# Patient Record
Sex: Female | Born: 1980 | Race: Black or African American | Hispanic: No | Marital: Single | Smoking: Current some day smoker
Health system: Southern US, Community
[De-identification: ages and names within clinical notes are randomized; demographics above are authoritative.]

## PROBLEM LIST (undated history)

## (undated) DIAGNOSIS — I480 Paroxysmal atrial fibrillation: Secondary | ICD-10-CM

## (undated) HISTORY — DX: Morbid (severe) obesity due to excess calories: E66.01

## (undated) HISTORY — DX: Paroxysmal atrial fibrillation: I48.0

---

## 2010-05-10 ENCOUNTER — Emergency Department (HOSPITAL_BASED_OUTPATIENT_CLINIC_OR_DEPARTMENT_OTHER): Admission: EM | Admit: 2010-05-10 | Discharge: 2010-05-11 | Payer: Self-pay | Admitting: Emergency Medicine

## 2010-09-28 LAB — PREGNANCY, URINE: Preg Test, Ur: NEGATIVE

## 2010-09-28 LAB — RAPID STREP SCREEN (MED CTR MEBANE ONLY): Streptococcus, Group A Screen (Direct): NEGATIVE

## 2012-11-01 ENCOUNTER — Encounter (HOSPITAL_BASED_OUTPATIENT_CLINIC_OR_DEPARTMENT_OTHER): Payer: Self-pay | Admitting: Emergency Medicine

## 2012-11-01 ENCOUNTER — Emergency Department (HOSPITAL_BASED_OUTPATIENT_CLINIC_OR_DEPARTMENT_OTHER)
Admission: EM | Admit: 2012-11-01 | Discharge: 2012-11-01 | Disposition: A | Discharge status: Home / Self Care | Payer: Medicaid Other | Attending: Emergency Medicine | Admitting: Emergency Medicine

## 2012-11-01 DIAGNOSIS — F17200 Nicotine dependence, unspecified, uncomplicated: Secondary | ICD-10-CM | POA: Insufficient documentation

## 2012-11-01 DIAGNOSIS — M5412 Radiculopathy, cervical region: Secondary | ICD-10-CM | POA: Insufficient documentation

## 2012-11-01 MED ORDER — HYDROCODONE-ACETAMINOPHEN 5-325 MG PO TABS: 2.0000 | ORAL_TABLET | ORAL | Status: DC | PRN

## 2012-11-01 MED ORDER — PREDNISONE 20 MG PO TABS: ORAL_TABLET | ORAL | Status: DC

## 2012-11-01 NOTE — ED Notes (Signed)
PA-C at bedside 

## 2012-11-01 NOTE — ED Provider Notes (Signed)
History     CSN: [id number]  Arrival date & time 11/01/12  1027   First MD Initiated Contact with Patient 11/01/12 [phone]      Chief Complaint  Patient presents with  . Arm Pain    right    (Consider location/radiation/quality/duration/timing/severity/associated sxs/prior treatment) HPI This 32 year old female has a one-week history gradual onset constant waxing and waning right-sided neck pain radiating down the right arm to the hand without specific neurologic distribution, she is no weakness no trauma no swelling no redness to her arm and she has had intermittent slight numbness and tingling at times they can last for several hours at a time is not present today. She is no trauma. She is no midline neck pain. She is no chest pain palpitations cough shortness breath abdominal pain back pain or other concerns. There is no treatment prior to arrival. Her pain is mild this morning was moderately severe at nighttime for several days. Her pain is worse with certain position changes of her head and right arm. She has not had problems like this in the past. Her pain is worse with position changes of the right arm when working. History reviewed. No pertinent past medical history.  History reviewed. No pertinent past surgical history.  No family history on file.  History  Substance Use Topics  . Smoking status: Current Some Day Smoker  . Smokeless tobacco: Not on file  . Alcohol Use: Yes     Comment: socially    OB History   Grav Para Term Preterm Abortions TAB SAB Ect Mult Living                  Review of Systems 10 Systems reviewed and are negative for acute change except as noted in the HPI. Allergies  Review of patient's allergies indicates no known allergies.  Home Medications   Current Outpatient Rx  Name  Route  Sig  Dispense  Refill  . HYDROcodone-acetaminophen (NORCO) 5-325 MG per tablet   Oral   Take 2 tablets by mouth every 4 (four) hours as needed for pain.   20  tablet   0   . predniSONE (DELTASONE) 20 MG tablet      3 tabs po day one, then 2 tabs daily x 4 days   11 tablet   0     BP 151/90  Pulse 85  Temp(Src) 97.5 F (36.4 C) (Oral)  Resp 16  Ht 5\' 1"  (1.549 m)  Wt 260 lb (117.935 kg)  BMI 49.15 kg/m2  SpO2 100%  LMP 10/25/2012  Physical Exam  Nursing note and vitals reviewed. Constitutional:  Awake, alert, nontoxic appearance.  HENT:  Head: Atraumatic.  Eyes: Right eye exhibits no discharge. Left eye exhibits no discharge.  Neck: Neck supple.  No cervical midline tenderness, the patient has reproducible right paracervical soft tissue tenderness right trapezius muscle region tenderness  Cardiovascular: Normal rate and regular rhythm.   No murmur heard. Pulmonary/Chest: Effort normal and breath sounds normal. No respiratory distress. She has no wheezes. She has no rales. She exhibits no tenderness.  Abdominal: Soft. There is no tenderness. There is no rebound.  Musculoskeletal: She exhibits no tenderness.  Baseline ROM, no obvious new focal weakness. Right arm and shoulder are nontender. Left arm and legs are nontender. Right arm has normal light touch image durations of the axillary median radial and ulnar nerve function as well as out of 5 motor strength testing in dosing nerve distributions with normal light  touch and capillary refill less than 2 seconds symmetric bilateral upper extremity reflexes both arms are nontender without swelling or color change noted.  Neurological:  Mental status and motor strength appears baseline for patient and situation.  Skin: No rash noted.  Psychiatric: She has a normal mood and affect.    ED Course  Procedures (including critical care time)  Labs Reviewed - No data to display No results found.   1. Cervical radiculopathy       MDM  Patient informed of clinical course, understand medical decision-making process, and agree with plan. I doubt any other EMC precluding discharge at  this time including, but not necessarily limited to the following:CVA.        Hurman Horn, MD 11/01/12 [phone]

## 2012-11-01 NOTE — ED Notes (Signed)
Pt reports intermittant Rt arm pain that radiates from Rt scapula down Rt arm to fingertips x 1 week. Denies injury, chest pain, or other s/sx.

## 2015-08-08 ENCOUNTER — Encounter (HOSPITAL_BASED_OUTPATIENT_CLINIC_OR_DEPARTMENT_OTHER): Payer: Self-pay | Admitting: *Deleted

## 2015-08-08 ENCOUNTER — Emergency Department (HOSPITAL_BASED_OUTPATIENT_CLINIC_OR_DEPARTMENT_OTHER)
Admission: EM | Admit: 2015-08-08 | Discharge: 2015-08-08 | Disposition: A | Discharge status: Home / Self Care | Payer: Medicaid Other | Attending: Emergency Medicine | Admitting: Emergency Medicine

## 2015-08-08 ENCOUNTER — Emergency Department (HOSPITAL_BASED_OUTPATIENT_CLINIC_OR_DEPARTMENT_OTHER): Payer: Medicaid Other

## 2015-08-08 DIAGNOSIS — F17200 Nicotine dependence, unspecified, uncomplicated: Secondary | ICD-10-CM | POA: Insufficient documentation

## 2015-08-08 DIAGNOSIS — I4891 Unspecified atrial fibrillation: Secondary | ICD-10-CM | POA: Insufficient documentation

## 2015-08-08 DIAGNOSIS — Z3202 Encounter for pregnancy test, result negative: Secondary | ICD-10-CM | POA: Diagnosis not present

## 2015-08-08 DIAGNOSIS — R110 Nausea: Secondary | ICD-10-CM | POA: Insufficient documentation

## 2015-08-08 DIAGNOSIS — R55 Syncope and collapse: Secondary | ICD-10-CM | POA: Insufficient documentation

## 2015-08-08 DIAGNOSIS — R079 Chest pain, unspecified: Secondary | ICD-10-CM | POA: Diagnosis present

## 2015-08-08 LAB — CBC WITH DIFFERENTIAL/PLATELET
BASOS ABS: 0 10*3/uL (ref 0.0–0.1)
BASOS PCT: 0 %
EOS ABS: 0 10*3/uL (ref 0.0–0.7)
EOS PCT: 1 %
HCT: 39.5 % (ref 36.0–46.0)
Hemoglobin: 12.8 g/dL (ref 12.0–15.0)
LYMPHS ABS: 2.6 10*3/uL (ref 0.7–4.0)
Lymphocytes Relative: 40 %
MCH: 22.5 pg — ABNORMAL LOW (ref 26.0–34.0)
MCHC: 32.4 g/dL (ref 30.0–36.0)
MCV: 69.4 fL — AB (ref 78.0–100.0)
Monocytes Absolute: 0.6 10*3/uL (ref 0.1–1.0)
Monocytes Relative: 9 %
NEUTROS PCT: 50 %
Neutro Abs: 3.2 10*3/uL (ref 1.7–7.7)
PLATELETS: 238 10*3/uL (ref 150–400)
RBC: 5.69 MIL/uL — AB (ref 3.87–5.11)
RDW: 15.1 % (ref 11.5–15.5)
WBC: 6.4 10*3/uL (ref 4.0–10.5)

## 2015-08-08 LAB — COMPREHENSIVE METABOLIC PANEL
ALK PHOS: 54 U/L (ref 38–126)
ALT: 42 U/L (ref 14–54)
ANION GAP: 9 (ref 5–15)
AST: 56 U/L — ABNORMAL HIGH (ref 15–41)
Albumin: 4 g/dL (ref 3.5–5.0)
BUN: 10 mg/dL (ref 6–20)
CALCIUM: 9.1 mg/dL (ref 8.9–10.3)
CHLORIDE: 104 mmol/L (ref 101–111)
CO2: 25 mmol/L (ref 22–32)
CREATININE: 0.76 mg/dL (ref 0.44–1.00)
Glucose, Bld: 143 mg/dL — ABNORMAL HIGH (ref 65–99)
Potassium: 4 mmol/L (ref 3.5–5.1)
SODIUM: 138 mmol/L (ref 135–145)
Total Bilirubin: 0.4 mg/dL (ref 0.3–1.2)
Total Protein: 7.7 g/dL (ref 6.5–8.1)

## 2015-08-08 LAB — MAGNESIUM: Magnesium: 2 mg/dL (ref 1.7–2.4)

## 2015-08-08 LAB — TSH: TSH: 2.256 u[IU]/mL (ref 0.350–4.500)

## 2015-08-08 LAB — TROPONIN I

## 2015-08-08 LAB — T4, FREE: FREE T4: 1.02 ng/dL (ref 0.61–1.12)

## 2015-08-08 LAB — PREGNANCY, URINE: PREG TEST UR: NEGATIVE

## 2015-08-08 LAB — D-DIMER, QUANTITATIVE: D-Dimer, Quant: 0.35 ug/mL-FEU (ref 0.00–0.50)

## 2015-08-08 MED ORDER — PROPOFOL 10 MG/ML IV BOLUS
INTRAVENOUS | Status: AC
  Filled 2015-08-08: qty 40

## 2015-08-08 MED ORDER — ADENOSINE 6 MG/2ML IV SOLN
INTRAVENOUS | Status: AC
  Filled 2015-08-08: qty 6

## 2015-08-08 MED ORDER — DILTIAZEM HCL 25 MG/5ML IV SOLN
15.0000 mg | Freq: Once | INTRAVENOUS | Status: AC
  Administered 2015-08-08: 15 mg via INTRAVENOUS
  Filled 2015-08-08 (×2): qty 5

## 2015-08-08 MED ORDER — ETOMIDATE 2 MG/ML IV SOLN
10.0000 mg | Freq: Once | INTRAVENOUS | Status: AC
  Administered 2015-08-08: 10 mg via INTRAVENOUS

## 2015-08-08 MED ORDER — SODIUM CHLORIDE 0.9 % IV BOLUS (SEPSIS)
1000.0000 mL | Freq: Once | INTRAVENOUS | Status: AC
  Administered 2015-08-08: 1000 mL via INTRAVENOUS

## 2015-08-08 MED ORDER — ETOMIDATE 2 MG/ML IV SOLN
INTRAVENOUS | Status: AC
  Filled 2015-08-08: qty 10

## 2015-08-08 NOTE — ED Notes (Signed)
EDP to discuss options for treatment

## 2015-08-08 NOTE — ED Notes (Signed)
Pt responds to light verbal stimuli, follows commands w/o hesitiation

## 2015-08-08 NOTE — ED Notes (Signed)
PCXR done

## 2015-08-08 NOTE — ED Provider Notes (Signed)
CSN: [id number]     Arrival date without improvement.  & time 08/08/15  8295 History   First MD Initiated Contact with Patient 08/08/15 [phone]     Chief Complaint  Patient presents with  . Chest Pain  .   (Consider location/radiation/quality/duration/timing/severity/associated sxs/prior Treatment) Patient is a 35 y.o. female presenting with chest pain.  Chest Pain Pain location:  Substernal area Pain quality: pressure   Pain radiates to:  L arm Pain radiates to the back: no   Pain severity:  Severe Onset quality:  Sudden Timing:  Constant Progression:  Unchanged Chronicity:  New Relieved by:  Nothing Worsened by:  Nothing tried Ineffective treatments:  None tried Associated symptoms: fatigue, nausea, near-syncope and shortness of breath   Associated symptoms: no abdominal pain, no back pain, no cough, no diaphoresis, no fever, no headache and not vomiting   Risk factors: no diabetes mellitus, no high cholesterol, no hypertension, no prior DVT/PE and no surgery     History reviewed. No pertinent past medical history. History reviewed. No pertinent past surgical history. No family history on file. Social History  Substance Use Topics  . Smoking status: Current Some Day Smoker  . Smokeless tobacco: None  . Alcohol Use: Yes     Comment: socially   OB History    No data available     Review of Systems  Constitutional: Positive for fatigue. Negative for fever and diaphoresis.  HENT: Negative for sore throat.   Eyes: Negative for visual disturbance.  Respiratory: Positive for shortness of breath. Negative for cough.   Cardiovascular: Positive for chest pain and near-syncope.  Gastrointestinal: Positive for nausea. Negative for vomiting, abdominal pain, diarrhea and constipation.  Genitourinary: Negative for dysuria and difficulty urinating. Frequency: chronic.  Musculoskeletal: Negative for back pain and neck pain.  Skin: Negative for rash.  Neurological: Negative for  syncope and headaches.      Allergies  Review of patient's allergies indicates no known allergies.  Home Medications   Prior to Admission medications   Medication Sig Start Date End Date Taking? Authorizing Provider  HYDROcodone-acetaminophen (NORCO) 5-325 MG per tablet Take 2 tablets by mouth every 4 (four) hours as needed for pain. 11/01/12   Wayland Salinas, MD  predniSONE (DELTASONE) 20 MG tablet 3 tabs po day one, then 2 tabs daily x 4 days 11/01/12   Wayland Salinas, MD   BP 142/98 mmHg  Pulse 82  Temp(Src) 97.9 F (36.6 C) (Oral)  Resp 20  SpO2 98%  LMP  (LMP Unknown) Physical Exam  Constitutional: She is oriented to person, place, and time. She appears well-developed and well-nourished. No distress.  HENT:  Head: Normocephalic and atraumatic.  Eyes: Conjunctivae and EOM are normal.  Neck: Normal range of motion.  Cardiovascular: Normal heart sounds and intact distal pulses.  An irregularly irregular rhythm present. Tachycardia present.  Exam reveals no gallop and no friction rub.   No murmur heard. Pulmonary/Chest: Effort normal and breath sounds normal. No respiratory distress. She has no wheezes. She has no rales.  Abdominal: Soft. She exhibits no distension. There is no tenderness. There is no guarding.  Musculoskeletal: She exhibits no edema or tenderness.  Neurological: She is alert and oriented to person, place, and time. She has normal strength. GCS eye subscore is 4. GCS verbal subscore is 5. GCS motor subscore is 6.  Skin: Skin is warm and dry. No rash noted. She is not diaphoretic. No erythema.  Nursing note and vitals reviewed.   ED  Course  .Sedation Date/Time: 08/08/2015 9:49 AM Performed by: Alvira Monday Authorized by: Alvira Monday  Consent:    Consent obtained:  Verbal and written   Consent given by:  Patient Indications:    Sedation purpose:  Cardioversion   Procedure necessitating sedation performed by:  Physician performing sedation   Intended  level of sedation:  Moderate (conscious sedation) Pre-sedation assessment:    ASA classification: class 1 - normal, healthy patient     Neck mobility: normal     Mouth opening:  3 or more finger widths   Mallampati score:  III - soft palate, base of uvula visible   Pre-sedation assessments completed and reviewed: airway patency, cardiovascular function, hydration status, mental status, nausea/vomiting, pain level and respiratory function   Immediate pre-procedure details:    Reassessment: Patient reassessed immediately prior to procedure     Reviewed: vital signs, relevant labs/tests and NPO status     Verified: bag valve mask available, emergency equipment available, intubation equipment available, IV patency confirmed, oxygen available and suction available   Procedure details (see MAR for exact dosages):    Sedation start time:  08/08/2015 9:19 AM   Preoxygenation:  Nasal cannula   Sedation:  Etomidate   Intra-procedure monitoring:  Blood pressure monitoring, continuous capnometry, frequent LOC assessments, frequent vital sign checks, continuous pulse oximetry and cardiac monitor   Intra-procedure events: none     Intra-procedure management:  Airway repositioning   Sedation end time:  08/08/2015 9:25 AM Post-procedure details:    Post-sedation assessment completed:  08/08/2015 9:53 AM   Attendance: Constant attendance by certified staff until patient recovered     Recovery: Patient returned to pre-procedure baseline     Post-sedation assessments completed and reviewed: airway patency, cardiovascular function, hydration status, mental status, nausea/vomiting, pain level and respiratory function     Patient is stable for discharge or admission: Yes     Patient tolerance:  Tolerated well, no immediate complications .Cardioversion Date/Time: 08/08/2015 9:53 AM Performed by: Alvira Monday Authorized by: Alvira Monday Consent: Verbal consent obtained. Written consent obtained. Risks and  benefits: risks, benefits and alternatives were discussed Consent given by: patient Required items: required blood products, implants, devices, and special equipment available Patient identity confirmed: verbally with patient Time out: Immediately prior to procedure a "time out" was called to verify the correct patient, procedure, equipment, support staff and site/side marked as required. Patient sedated: yes Sedation type: moderate (conscious) sedation Sedatives: etomidate Sedation start date/time: 08/08/2015 9:19 AM Sedation end date/time: 08/08/2015 9:25 AM Cardioversion basis: emergent Pre-procedure rhythm: atrial fibrillation Patient position: patient was placed in a supine position Chest area: chest area exposed Electrodes: pads Number of attempts: 1 Attempt 1 mode: synchronous Attempt 1 shock (in Joules): 100 Attempt 1 outcome: conversion to normal sinus rhythm Post-procedure rhythm: normal sinus rhythm Patient tolerance: Patient tolerated the procedure well with no immediate complications   (including critical care time) Labs Review Labs Reviewed  CBC WITH DIFFERENTIAL/PLATELET - Abnormal; Notable for the following:    RBC 5.69 (*)    MCV 69.4 (*)    MCH 22.5 (*)    All other components within normal limits  COMPREHENSIVE METABOLIC PANEL - Abnormal; Notable for the following:    Glucose, Bld 143 (*)    AST 56 (*)    All other components within normal limits  TROPONIN I  TSH  PREGNANCY, URINE  T4, FREE  MAGNESIUM  D-DIMER, QUANTITATIVE (NOT AT [facility])    Imaging Review Dg Chest Portable  1 View  08/08/2015  CLINICAL DATA:  Shortness of breath, tachycardia and chest pain. EXAM: PORTABLE CHEST 1 VIEW COMPARISON:  05/11/2010 FINDINGS: The heart size and mediastinal contours are within normal limits. There is no evidence of pulmonary edema, consolidation, pneumothorax, nodule or pleural fluid. The visualized skeletal structures are unremarkable. IMPRESSION: No active  disease. Electronically Signed   By: Irish Lack M.D.   On: 08/08/2015 09:06   I have personally reviewed and evaluated these images and lab results as part of my medical decision-making.   EKG Interpretation   Date/Time:  Sunday August 08 2015 09:21:55 EST Ventricular Rate:  111 PR Interval:  120 QRS Duration: 79 QT Interval:  326 QTC Calculation: 443 R Axis:   51 Text Interpretation:  Sinus tachycardia Borderline repolarization  abnormality, nonspecific TW changes Since last ECG, atrial fibrillation  has resolved Confirmed by [facility] MD, Nezar Buckles ([id number]) on 08/08/2015 9:34:36  AM      CRITICAL CARE: Atrial fibrillation with RVR Performed by: Rhae Lerner   Total critical care time: 30 minutes  Critical care time was exclusive of separately billable procedures and treating other patients.  Critical care was necessary to treat or prevent imminent or life-threatening deterioration.  Critical care was time spent personally by me on the following activities: development of treatment plan with patient and/or surrogate as well as nursing, discussions with consultants, evaluation of patient's response to treatment, examination of patient, obtaining history from patient or surrogate, ordering and performing treatments and interventions, ordering and review of laboratory studies, ordering and review of radiographic studies, pulse oximetry and re-evaluation of patient's condition.  MDM   Final diagnoses:  Atrial fibrillation with RVR (HCC)   34 year old female with no significant medical history presents with concern of chest pressure and shortness of breath.  Patient tachycardic in triage and vagal manuevers attempted with no change. On arrival to pt's room in the emergency department patient is noted to be in atrial fibrillation with rapid ventricular rate, which is likely etiology of her chest pressure. 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   Labs show electrolytes WNL.  Troponin WNL.  DDimer negative and pt low risk Wells and doubt PE.  Reports drinking etoh last night, and not having symptoms until waking this AM at 8AM  Patient given 15 mg of diltiazem without change in heart rate.  Discussed risks and benefits with patient of cardioversion versus initiating diltiazem gtt, and given chest pressure with no other medical problems, and atrial fibrillation less than 24hr, agreed on cardioversion.  Patient cardioverted with 100J with return to sinus rhythm. Patient chest pain-free after return to normal sinus rhythm.  After cardioversion, discussed follow up with Dr. Elberta Fortis of Cardiology and agree that in setting of likely holiday heart, patient will likely not need rate controlling medications, however will follow up closely in rhythm clinic. GIven IVF with improvement in HR to 90s. BP majority 130s/80s. Patient feels back to baseline. Patient discharged in stable condition with understanding of reasons to return.     Alvira Monday, MD 08/08/15 2053

## 2015-08-08 NOTE — ED Notes (Signed)
EDP immediately to room

## 2015-08-08 NOTE — ED Notes (Signed)
Pt up to bathroom, voices no complaints, gait very steady, required min assistance

## 2015-08-08 NOTE — ED Notes (Signed)
Pt alert and oriented, able to tx to bedside commode with min assistance.

## 2015-08-08 NOTE — ED Notes (Signed)
Pt states has chest pain , onset this am, pt was awoken with chest pain, ant chest pain with radiation to left arm

## 2015-08-08 NOTE — Discharge Instructions (Signed)
Atrial Fibrillation °Atrial fibrillation is a type of irregular or rapid heartbeat (arrhythmia). In atrial fibrillation, the heart quivers continuously in a chaotic pattern. This occurs when parts of the heart receive disorganized signals that make the heart unable to pump blood normally. This can increase the risk for stroke, heart failure, and other heart-related conditions. There are different types of atrial fibrillation, including: °· Paroxysmal atrial fibrillation. This type starts suddenly, and it usually stops on its own shortly after it starts. °· Persistent atrial fibrillation. This type often lasts longer than a week. It may stop on its own or with treatment. °· Long-lasting persistent atrial fibrillation. This type lasts longer than 12 months. °· Permanent atrial fibrillation. This type does not go away. °Talk with your health care provider to learn about the type of atrial fibrillation that you have. °CAUSES °This condition is caused by some heart-related conditions or procedures, including: °· A heart attack. °· Coronary artery disease. °· Heart failure. °· Heart valve conditions. °· High blood pressure. °· Inflammation of the sac that surrounds the heart (pericarditis). °· Heart surgery. °· Certain heart rhythm disorders, such as Wolf-Parkinson-White syndrome. °Other causes include: °· Pneumonia. °· Obstructive sleep apnea. °· Blockage of an artery in the lungs (pulmonary embolism, or PE). °· Lung cancer. °· Chronic lung disease. °· Thyroid problems, especially if the thyroid is overactive (hyperthyroidism). °· Caffeine. °· Excessive alcohol use or illegal drug use. °· Use of some medicines, including certain decongestants and diet pills. °Sometimes, the cause cannot be found. °RISK FACTORS °This condition is more likely to develop in: °· People who are older in age. °· People who smoke. °· People who have diabetes mellitus. °· People who are overweight (obese). °· Athletes who exercise  vigorously. °SYMPTOMS °Symptoms of this condition include: °· A feeling that your heart is beating rapidly or irregularly. °· A feeling of discomfort or pain in your chest. °· Shortness of breath. °· Sudden light-headedness or weakness. °· Getting tired easily during exercise. °In some cases, there are no symptoms. °DIAGNOSIS °Your health care provider may be able to detect atrial fibrillation when taking your pulse. If detected, this condition may be diagnosed with: °· An electrocardiogram (ECG). °· A Holter monitor test that records your heartbeat patterns over a 24-hour period. °· Transthoracic echocardiogram (TTE) to evaluate how blood flows through your heart. °· Transesophageal echocardiogram (TEE) to view more detailed images of your heart. °· A stress test. °· Imaging tests, such as a CT scan or chest X-ray. °· Blood tests. °TREATMENT °The main goals of treatment are to prevent blood clots from forming and to keep your heart beating at a normal rate and rhythm. The type of treatment that you receive depends on many factors, such as your underlying medical conditions and how you feel when you are experiencing atrial fibrillation. °This condition may be treated with: °· Medicine to slow down the heart rate, bring the heart's rhythm back to normal, or prevent clots from forming. °· Electrical cardioversion. This is a procedure that resets your heart's rhythm by delivering a controlled, low-energy shock to the heart through your skin. °· Different types of ablation, such as catheter ablation, catheter ablation with pacemaker, or surgical ablation. These procedures destroy the heart tissues that send abnormal signals. When the pacemaker is used, it is placed under your skin to help your heart beat in a regular rhythm. °HOME CARE INSTRUCTIONS °· Take over-the counter and prescription medicines only as told by your health care provider. °·   If your health care provider prescribed a blood-thinning medicine  (anticoagulant), take it exactly as told. Taking too much blood-thinning medicine can cause bleeding. If you do not take enough blood-thinning medicine, you will not have the protection that you need against stroke and other problems.  Do not use tobacco products, including cigarettes, chewing tobacco, and e-cigarettes. If you need help quitting, ask your health care provider.  If you have obstructive sleep apnea, manage your condition as told by your health care provider.  Do not drink alcohol.  Do not drink beverages that contain caffeine, such as coffee, soda, and tea.  Maintain a healthy weight. Do not use diet pills unless your health care provider approves. Diet pills may make heart problems worse.  Follow diet instructions as told by your health care provider.  Exercise regularly as told by your health care provider.  Keep all follow-up visits as told by your health care provider. This is important. PREVENTION  Avoid drinking beverages that contain caffeine or alcohol.  Avoid certain medicines, especially medicines that are used for breathing problems.  Avoid certain herbs and herbal medicines, such as those that contain ephedra or ginseng.  Do not use illegal drugs, such as cocaine and amphetamines.  Do not smoke.  Manage your high blood pressure. SEEK MEDICAL CARE IF:  You notice a change in the rate, rhythm, or strength of your heartbeat.  You are taking an anticoagulant and you notice increased bruising.  You tire more easily when you exercise or exert yourself. SEEK IMMEDIATE MEDICAL CARE IF:  You have chest pain, abdominal pain, sweating, or weakness.  You feel nauseous.  You notice blood in your vomit, bowel movement, or urine.  You have shortness of breath.  You suddenly have swollen feet and ankles.  You feel dizzy.  You have sudden weakness or numbness of the face, arm, or leg, especially on one side of the body.  You have trouble speaking,  trouble understanding, or both (aphasia).  Your face or your eyelid droops on one side. These symptoms may represent a serious problem that is an emergency. Do not wait to see if the symptoms will go away. Get medical help right away. Call your local emergency services (911 in the [place] Do not drive yourself to the hospital.   This information is not intended to replace advice given to you by your health care provider. Make sure you discuss any questions you have with your health care provider.   Document Released: 07/03/2005 Document Revised: 03/24/2015 Document Reviewed: 10/28/2014 Elsevier Interactive Patient Education 2016 [employer].  Emergency Department Resource Guide 1) Find a Doctor and Pay Out of Pocket Although you won't have to find out who is covered by your insurance plan, it is a good idea to ask around and get recommendations. You will then need to call the office and see if the doctor you have chosen will accept you as a new patient and what types of options they offer for patients who are self-pay. Some doctors offer discounts or will set up payment plans for their patients who do not have insurance, but you will need to ask so you aren't surprised when you get to your appointment.  2) Contact Your Local Health Department Not all health departments have doctors that can see patients for sick visits, but many do, so it is worth a call to see if yours does. If you don't know where your local health department is, you can check in your phone  book. The CDC also has a tool to help you locate your state's health department, and many state websites also have listings of all of their local health departments.  3) Find a Walk-in Clinic If your illness is not likely to be very severe or complicated, you may want to try a walk in clinic. These are popping up all over the country in pharmacies, drugstores, and shopping centers. They're usually staffed by nurse practitioners or physician  assistants that have been trained to treat common illnesses and complaints. They're usually fairly quick and inexpensive. However, if you have serious medical issues or chronic medical problems, these are probably not your best option.  No Primary Care Doctor: - Call Health Connect at  [phone] - they can help you locate a primary care doctor that  accepts your insurance, provides certain services, etc. - Physician Referral Service- [phone]  Chronic Pain Problems: Organization         Address  Phone   Notes  [facility]  [phone] Patients need to be referred by their primary care doctor.   Medication Assistance: Organization         Address  Phone   Notes  [facility] Medication [facility] [address] [place], [place] [address] [phone] --Must be a resident of [facility] [place] -- Must have NO insurance coverage whatsoever (no Medicaid/ Medicare, etc.) -- The pt. MUST have a primary care doctor that directs their care regularly and follows them in the community   MedAssist  [phone]   [employer]  [phone]    Agencies that provide inexpensive medical care: Organization         Address  Phone   Notes  [facility]  [phone]   [facility]    [phone]   [facility] [address] [place], [place] [address] [phone]   [facility] [address], [place] [phone]   Planned Parenthood    [phone]   [facility]    [phone]   Community Health and [facility]  [address], [place] Phone:  [phone], Fax:  [phone] Hours of Operation:  9 am - 6 pm, M-F.  Also accepts Medicaid/Medicare and self-pay.  [facility]  [address], [place] Phone: [phone], Fax: [phone]) [phone]. Hours of Operation:  8:30 am - 5:30 pm, M-F.  Also accepts  Medicaid and self-pay.  [facility] [place] [address], [place] Phone: [phone]) [phone]   [facility] [address], [place], [place] [phone], Ext. [phone] Mondays & Thursdays: 7-9 AM.  First 15 patients are seen on a first come, first serve basis.    Medicaid-accepting [facility] Providers:  Organization         Address  Phone   Notes  [facility] [address], [place] [phone] Also accepts self-pay patients.  [facility] [address], [place]  [phone]   [facility] [address], [place] [phone]   [facility] [address] [place] [address], [place] [phone]   Renaye RakersVeita Bland [address], [place]   [phone] Only accepts [place] Access [place] patients after they have their name applied to their card.  Self-Pay (no insurance) in [facility]:  Organization         Address  Phone   Notes  Sickle Cell Patients, [facility] [place] [phone]   [facility] Urgent Care Protection [phone]   [facility] [place]  [place], [place], [place] [phone]   [facility]/Dr. Osei-Bonsu  [address] [place] or [address], [place] [phone] Phone number for both [place] and [place] locations is the same.  Urgent Medical and [facility] [address], [place] [phone]   [facility] [address] [place] or [address] [phone]   [facility] [address] [place] [phone], phone; [phone], fax Sees patients 1st and 3rd Saturday of every month.  Must not qualify for public or private insurance (i.e. Medicaid, Medicare, [place] Health Choice, Veterans' Benefits)  Household  income should be no more than 200% of the poverty level The clinic cannot treat you if you are pregnant or think you are pregnant  Sexually transmitted diseases are not treated at the clinic.    Dental Care: Organization         Address  Phone  Notes  [facility] Department of [facility] [place] [phone] Accepts children up to age 36 who are enrolled in [place] or [place]; pregnant women with a Medicaid card; and children who have applied for Medicaid or [place] Health Choice, but were declined, whose parents can pay a reduced fee at time of service.  [facility] Department of [facility]  [address], [place] [phone] Accepts children up to age 23 who are enrolled in [place] or [place]; pregnant women with a Medicaid card; and children who have applied for Medicaid or [place] Health Choice, but were declined, whose parents can pay a reduced fee at time of service.  [place] Adult [facility] Access PROGRAM  [facility] [phone] Patients are seen by appointment only. Walk-ins are not accepted. [place] will see patients 72 years of age and older. Monday - Tuesday (8am-5pm) Most Wednesdays (8:30-5pm) $30 per visit, cash only  [facility] Adult Dental Access PROGRAM  [address], [facility] [phone] Patients are seen by appointment only. Walk-ins are not accepted. [place] will see patients 85 years of age and older. One Wednesday Evening (Monthly: Volunteer Based).  $30 per visit, cash only  [place]  [phone] for adults; Children under age 67, call Graduate Pediatric Dentistry at [phone]. Children aged 51-14, please call [phone] to request a pediatric application.  Dental services are provided in all areas of dental care including fillings, crowns and bridges, complete and partial dentures, implants, gum  treatment, root canals, and extractions. Preventive care is also provided. Treatment is provided to both adults and children. Patients are selected via a lottery and there is often a waiting list.   [facility] [address], [place]  [phone] [email or web address]   [facility] [address] [place], [place] [phone], Ext. [phone] Second and Fourth Thursday of each month, opens at 6:30 AM; Clinic ends at 9 AM.  Patients are seen on a first-come first-served basis, and a limited number  are seen during each clinic.   [facility]  [address] Hillard Danker [place], [place] [phone]   Eligibility Requirements You must have lived in [place], [place], or [place] counties for at least the last three months.   You cannot be eligible for state or federal sponsored [employer], including [employer], [place], or [employer].   You generally cannot be eligible for healthcare insurance through your employer.    How to apply: Eligibility screenings are held every Tuesday and Wednesday afternoon from 1:00 pm until 4:00 pm. You do not need an appointment for the interview!  [facility] [address] [place], [place], [place]   [place]  [place] Department  [place]  [phone]    Behavioral Health Resources in the Community: Intensive Outpatient Programs Organization         Address  Phone  Notes  [facility] [place]. [address] [place], [place] [phone]   [facility] Outpatient [address] [place], [place]   ADS: Alcohol & Drug Svcs [address], [place], [place]   [place] [address],  [facility], [place] or [phone]   Substance Abuse Resources Organization         Address  Phone  Notes  Alcohol and  Drug Services  [phone]   [place]  [phone]   The [place]   Chinita Pester  [phone]   Residential & Outpatient Substance Abuse Program  [phone]   Psychological Services Organization         Address  Phone  Notes  [facility] [place]  [place]  [phone]   [place] [address] [place] or [phone]    Mobile Crisis Teams Organization         Address  Phone  Notes  [facility], Mobile Crisis Care Unit  [phone]   [facility]  [address] [place], [place]   [place] Levels [address] [place] [phone]    Self-Help/Support Groups Organization         Address  Phone             Notes  [place]. of [place] - variety of support groups  [place] Call for more information  Narcotics Anonymous (NA), [facility]aring [facility] [address], [employer] [place]  2 meetings at this location   Special educational needs teacher         Address  Phone  Notes  ASAP Residential Treatment [place],    [place]  [phone]   [facility]  [address], [place] [id number], [place], [place]   [place], [address] [phone] Admissions: 8am-3pm M-F  Incentives Substance [place] [address],    [place], [place] [phone]   [facility] [address] Jadene Pierini [place], [place]   [facility] [address]  [place], [place]   Insight Programs - Intensive Outpatient [address], [place], [place]   [facility] ([place].,  [place], [place] or [phone]   [facility] Services (RTS) [address] [place], [place] Accepts Medicaid  Fellowship  [place] [address]  [place] [phone] Substance Abuse/Addiction Treatment   [facility] Organization         Address  Phone  Notes  [facility]  [phone]   Angie Fava, PhD [address] Ervin Knack [place], [place]   [phone] or [phone]   [facility] Behavioral   [address] [place], [place] [phone]   [facility] [address], [place], [place] [phone] Insurance/Medicaid/sponsorship through [facility] and Families [address]                                    [place], [place] [phone] Therapy/tele-psych/case  [facility] [address], [place] [phone]    Dr. Lolly Mustache  [phone]   [facility] [place]  [employer] [facility] In[facility] Care Facility Dept. 1) [address] [place] 2) [address], [place] 3)  [address], [place] [phone]  [phone]   [facility] Child Abuse Hotline [phone] or [phone] (After Hours)

## 2015-08-08 NOTE — ED Notes (Signed)
Pt resting quietly, cont on cardiac monitor with cont POX and int NBP q78min

## 2015-08-08 NOTE — Sedation Documentation (Signed)
Patient denies pain and is resting comfortably.  

## 2015-08-08 NOTE — ED Notes (Signed)
Family at bedside. 

## 2015-08-08 NOTE — Sedation Documentation (Signed)
Cardioverted at 100 J 

## 2015-08-10 ENCOUNTER — Ambulatory Visit (HOSPITAL_COMMUNITY)
Admission: RE | Admit: 2015-08-10 | Discharge: 2015-08-10 | Disposition: A | Discharge status: Home / Self Care | Payer: Medicaid Other | Source: Ambulatory Visit | Attending: Nurse Practitioner | Admitting: Nurse Practitioner

## 2015-08-10 VITALS — BP 122/74 | HR 87 | Ht 62.0 in | Wt 275.8 lb

## 2015-08-10 DIAGNOSIS — Z6841 Body Mass Index (BMI) 40.0 and over, adult: Secondary | ICD-10-CM | POA: Insufficient documentation

## 2015-08-10 DIAGNOSIS — I480 Paroxysmal atrial fibrillation: Secondary | ICD-10-CM | POA: Insufficient documentation

## 2015-08-10 DIAGNOSIS — R0683 Snoring: Secondary | ICD-10-CM | POA: Insufficient documentation

## 2015-08-10 DIAGNOSIS — F17200 Nicotine dependence, unspecified, uncomplicated: Secondary | ICD-10-CM | POA: Diagnosis not present

## 2015-08-11 NOTE — Progress Notes (Signed)
Patient ID: Victoria Melendez, female   DOB: 08-19-1980, 35 y.o.   MRN: [id number]     Primary Care Physician: No primary care provider on file. Referring Physician: ER f/u   Victoria Melendez is a 35 y.o. female with a h/o morbid obesity that had a ER visit 1/22 for afib with rvr.with concerns of chest pressure and shortness of breath. Patient tachycardic in triage and vagal manuevers attempted with no change. On arrival to pt's room in the emergency department patient is noted to be in atrial fibrillation with rapid ventricular rate, which is likely etiology of her chest pressure. 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 Labs show electrolytes WNL. Troponin WNL. DDimer negative and pt low risk Wells and doubt PE. Reports drinking etoh last night, and not having symptoms until waking this AM at 8AM  Patient given 15 mg of diltiazem without change in heart rate. Discussed risks and benefits with patient of cardioversion versus initiating diltiazem gtt, and given chest pressure with no other medical problems, and atrial fibrillation less than 24hr, agreed on cardioversion. Patient cardioverted with 100J with return to sinus rhythm. Patient chest pain-free after return to normal sinus rhythm. After cardioversion, discussed follow up with Dr. Elberta Fortis of Cardiology and agree that in setting of likely holiday heart, patient will likely not need rate controlling medications, however will follow up closely in rhythm clinic. GIven IVF with improvement in HR to 90s. BP majority 130s/80s. Patient feels back to baseline. Patient discharged in stable condition with understanding of reasons to return.   In the afib clinic today for f/u. She reports no further afib. Discussed triggers for afib. Discussed no more than 2 alcoholic drinks a week,she denies smoking, minimal caffeine. Discussed inactivity and obesity as being highly correlated with afib. She does snore and has periods  of apnea. No illicit drug use.  Today, she denies symptoms of palpitations, chest pain, shortness of breath, orthopnea, PND, lower extremity edema, dizziness, presyncope, syncope, or neurologic sequela. The patient is tolerating medications without difficulties and is otherwise without complaint today.   No past medical history on file. No past surgical history on file.  No current outpatient prescriptions on file.   No current facility-administered medications for this encounter.    No Known Allergies  Social History   Social History  . Marital Status: Single    Spouse Name: N/A  . Number of Children: N/A  . Years of Education: N/A   Occupational History  . Not on file.   Social History Main Topics  . Smoking status: Current Some Day Smoker  . Smokeless tobacco: Not on file  . Alcohol Use: Yes     Comment: socially  . Drug Use: No  . Sexual Activity: Yes    Birth Control/ Protection: None   Other Topics Concern  . Not on file   Social History Narrative    No family history on file.  ROS- All systems are reviewed and negative except as per the HPI above  Physical Exam: Filed Vitals:   08/10/15 1533  BP: 122/74  Pulse: 87  Height:  (1.575 m)  Weight: 275 lb 12.8 oz (125.102 kg)    GEN- The patient is well appearing, alert and oriented x 3 today.   Head- normocephalic, atraumatic Eyes-  Sclera clear, conjunctiva pink Ears- hearing intact Oropharynx- clear Neck- short, thick,supple, no JVP Lymph- no cervical lymphadenopathy Lungs- Clear to ausculation bilaterally, normal work of breathing Heart- Regular  rate and rhythm, no murmurs, rubs or gallops, PMI not laterally displaced GI- soft, NT, ND, + BS Extremities- no clubbing, cyanosis, or edema MS- no significant deformity or atrophy Skin- no rash or lesion Psych- euthymic mood, full affect Neuro- strength and sensation are intact  EKG-NSR, bi atrial enlargement pr int 124 ms, qrs int 68 ms, qtc  440 ms Epic records reviewed  Assessment and Plan: 1. PAF One episode with successful cardioversion in the ER Will not prescribe daily rate control unless proves herself to have further episodes Chadsvasc score is 0, no anticoagulation is needed  2. Lifestyle issues contributing to afib Morbid obesity- pt strongly urged to develop exercise program, limit weight loss  to achieve weight loss efforts, given number for nutritional services for her interest in nutritional counseling  Limit alcohol to no more than two drinks a week Snoring- weight loss would probably result in less snoring Will not pursue sleep study at this point but if cannot lose weight to improve symptoms, should pursue   afib clinic as needed  [facility] [address] [place], [place] [address] [phone]

## 2015-08-20 DIAGNOSIS — Z9889 Other specified postprocedural states: Secondary | ICD-10-CM

## 2015-08-20 DIAGNOSIS — Z9289 Personal history of other medical treatment: Secondary | ICD-10-CM

## 2015-08-20 DIAGNOSIS — I10 Essential (primary) hypertension: Secondary | ICD-10-CM

## 2015-08-20 DIAGNOSIS — N921 Excessive and frequent menstruation with irregular cycle: Secondary | ICD-10-CM

## 2015-08-20 HISTORY — DX: Excessive and frequent menstruation with irregular cycle: N92.1

## 2015-08-20 HISTORY — DX: Other specified postprocedural states: Z98.890

## 2015-08-20 HISTORY — DX: Essential (primary) hypertension: I10

## 2015-08-20 HISTORY — DX: Personal history of other medical treatment: Z92.89

## 2015-09-14 ENCOUNTER — Encounter: Payer: Medicaid Other | Attending: Nurse Practitioner | Admitting: Dietician

## 2015-09-14 ENCOUNTER — Encounter: Payer: Self-pay | Admitting: Dietician

## 2015-09-14 DIAGNOSIS — I480 Paroxysmal atrial fibrillation: Secondary | ICD-10-CM | POA: Diagnosis not present

## 2015-09-14 NOTE — Progress Notes (Signed)
  Medical Nutrition Therapy:  Appt start time: 1130 end time:  1215.   Assessment:  Primary concerns today: Patient is here alone.  She has newly diagnosed a.fib and wants to lose weight to prevent further health problems.  She has lost from a high weight of 277 lbs to today's weight of 270 lbs in the past month.  Low weight of 150 lbs prior to pregnancy.  Patient lives with her girlfriend and 33 yo daughter. She is currently not employed.  Friend and patient do the shopping.  Preferred Learning Style:   No preference indicated   Learning Readiness:   Change in progress  MEDICATIONS: see list   DIETARY INTAKE:  Usual eating pattern includes 2 meals and 1 snacks per day.  24-hr recall:  B ( AM): skips  Snk ( AM): none  L (1 PM): subway or salads with cheese, bacon, italian dressing Snk ( PM): none D ( PM): subway or baked chicken, vegetables (green beans, corn) Snk ( PM): chips Beverages: water  Usual physical activity: none  Estimated energy needs: 1600 calories 180 g carbohydrates 120 g protein 44 g fat  Progress Towards Goal(s):  In progress.   Nutritional Diagnosis:  NB-1.1 Food and nutrition-related knowledge deficit As related to balance of intake and energy expenditure.  As evidenced by BMI.    Intervention:  Nutrition counseling/education related to healthy eating and mindful eating for weight loss.  Eat Breakfast, Lunch, Dinner daily.   Aim to eat every 4 hours. Eat breakfast within 1 hour of waking. Slow down when you eat!  Put fork down between bites.  Practice chewing your food 15-20 times. Be mindful when you eat.  When am I hungry?  Stop when satisfied or full. Avoid eating in front of the TV. Be sure to have a small amount of protein with each meal and snack. Be a spreader not a glopper. Aim for 30 minutes of walking or dance most days of the week.  Teaching Method Utilized:  Visual Auditory Hands on  Handouts given during visit include:  My  plate  Meal plan card  Breakfast ideas  Barriers to learning/adherence to lifestyle change: none  Demonstrated degree of understanding via:  Teach Back   Monitoring/Evaluation:  Dietary intake, exercise, and body weight in 1 month(s).

## 2015-09-14 NOTE — Patient Instructions (Signed)
Eat Breakfast, Lunch, Dinner daily.   Aim to eat every 4 hours. Eat breakfast within 1 hour of waking. Slow down when you eat!  Put fork down between bites.  Practice chewing your food 15-20 times. Be mindful when you eat.  When am I hungry?  Stop when satisfied or full. Avoid eating in front of the TV. Be sure to have a small amount of protein with each meal and snack. Be a spreader not a glopper. Aim for 30 minutes of walking or dance most days of the week.

## 2015-10-18 ENCOUNTER — Ambulatory Visit: Payer: Medicaid Other | Admitting: Dietician

## 2015-11-23 ENCOUNTER — Ambulatory Visit: Payer: Medicaid Other | Admitting: Dietician

## 2015-12-19 ENCOUNTER — Encounter: Payer: Self-pay | Admitting: Nurse Practitioner

## 2016-07-19 ENCOUNTER — Encounter (HOSPITAL_BASED_OUTPATIENT_CLINIC_OR_DEPARTMENT_OTHER): Payer: Self-pay | Admitting: Emergency Medicine

## 2016-07-19 ENCOUNTER — Emergency Department (HOSPITAL_BASED_OUTPATIENT_CLINIC_OR_DEPARTMENT_OTHER)
Admission: EM | Admit: 2016-07-19 | Discharge: 2016-07-19 | Disposition: A | Discharge status: Home / Self Care | Payer: Medicaid Other | Attending: Emergency Medicine | Admitting: Emergency Medicine

## 2016-07-19 ENCOUNTER — Emergency Department (HOSPITAL_BASED_OUTPATIENT_CLINIC_OR_DEPARTMENT_OTHER): Payer: Medicaid Other

## 2016-07-19 DIAGNOSIS — Y929 Unspecified place or not applicable: Secondary | ICD-10-CM | POA: Diagnosis not present

## 2016-07-19 DIAGNOSIS — Z79899 Other long term (current) drug therapy: Secondary | ICD-10-CM | POA: Insufficient documentation

## 2016-07-19 DIAGNOSIS — Y999 Unspecified external cause status: Secondary | ICD-10-CM | POA: Insufficient documentation

## 2016-07-19 DIAGNOSIS — S99921A Unspecified injury of right foot, initial encounter: Secondary | ICD-10-CM | POA: Diagnosis present

## 2016-07-19 DIAGNOSIS — S91201A Unspecified open wound of right great toe with damage to nail, initial encounter: Secondary | ICD-10-CM | POA: Insufficient documentation

## 2016-07-19 DIAGNOSIS — Y939 Activity, unspecified: Secondary | ICD-10-CM | POA: Diagnosis not present

## 2016-07-19 DIAGNOSIS — R079 Chest pain, unspecified: Secondary | ICD-10-CM | POA: Diagnosis not present

## 2016-07-19 DIAGNOSIS — F17200 Nicotine dependence, unspecified, uncomplicated: Secondary | ICD-10-CM | POA: Diagnosis not present

## 2016-07-19 DIAGNOSIS — G8929 Other chronic pain: Secondary | ICD-10-CM

## 2016-07-19 DIAGNOSIS — W228XXA Striking against or struck by other objects, initial encounter: Secondary | ICD-10-CM | POA: Diagnosis not present

## 2016-07-19 LAB — CBC
HEMATOCRIT: 37.9 % (ref 36.0–46.0)
Hemoglobin: 12.2 g/dL (ref 12.0–15.0)
MCH: 22.2 pg — ABNORMAL LOW (ref 26.0–34.0)
MCHC: 32.2 g/dL (ref 30.0–36.0)
MCV: 68.9 fL — ABNORMAL LOW (ref 78.0–100.0)
PLATELETS: 252 10*3/uL (ref 150–400)
RBC: 5.5 MIL/uL — ABNORMAL HIGH (ref 3.87–5.11)
RDW: 15.1 % (ref 11.5–15.5)
WBC: 4.8 10*3/uL (ref 4.0–10.5)

## 2016-07-19 LAB — BASIC METABOLIC PANEL
Anion gap: 7 (ref 5–15)
BUN: 10 mg/dL (ref 6–20)
CO2: 27 mmol/L (ref 22–32)
CREATININE: 0.64 mg/dL (ref 0.44–1.00)
Calcium: 9 mg/dL (ref 8.9–10.3)
Chloride: 104 mmol/L (ref 101–111)
GFR calc Af Amer: >60 mL/min (ref >60)
GLUCOSE: 102 mg/dL — AB (ref 65–99)
POTASSIUM: 3.5 mmol/L (ref 3.5–5.1)
Sodium: 138 mmol/L (ref 135–145)

## 2016-07-19 LAB — TROPONIN I: Troponin I: <0.03 ng/mL (ref <0.03)

## 2016-07-19 NOTE — ED Provider Notes (Signed)
[facility] Provider [facility]   CSN: [id number] Arrival date & time: 07/19/16  0945     History   Chief Complaint Chief Complaint  Patient presents with  . Chest Pain  . Toe Pain    HPI Victoria Melendez is a 36 y.o. female.  The history is provided by the patient. No language interpreter was used.  Chest Pain   This is a new problem. Episode onset: 2 months. The problem occurs constantly. The problem has been gradually worsening. The pain is present in the substernal region. The pain is moderate. The quality of the pain is described as dull. The pain does not radiate. She has tried nothing for the symptoms. The treatment provided no relief.  Her past medical history is significant for arrhythmia.  Her family medical history is significant for hypertension.  Toe Pain  Associated symptoms include chest pain.  Pt reports she hit end of toe today and nail is bleeding.   Pt has a history of afib.  She has not followed up with cardiologist.  She stopped medications  Past Medical History:  Diagnosis Date  . Morbid obesity (HCC)   . Paroxysmal a-fib (HCC)     There are no active problems to display for this patient.   History reviewed. No pertinent surgical history.  OB History    No data available       Home Medications    Prior to Admission medications   Medication Sig Start Date End Date Taking? Authorizing Provider  atenolol (TENORMIN) 25 MG tablet Take by mouth daily.    Historical Provider, MD    Family History No family history on file.  Social History Social History  Substance Use Topics  . Smoking status: Current Some Day Smoker  . Smokeless tobacco: Never Used  . Alcohol use Yes     Comment: socially     Allergies   Patient has no known allergies.   Review of Systems Review of Systems  Cardiovascular: Positive for chest pain.  All other systems reviewed and are negative.    Physical Exam Updated Vital Signs BP (!) 168/127   Pulse  82   Temp 98.5 F (36.9 C)   Resp 21   Ht 5\' 2"  (1.575 m)   Wt 127 kg   LMP 07/12/2016   SpO2 94%   BMI 51.21 kg/m   Physical Exam  Constitutional: She appears well-developed and well-nourished. No distress.  HENT:  Head: Normocephalic and atraumatic.  Eyes: Conjunctivae are normal.  Neck: Neck supple.  Cardiovascular: Normal rate and regular rhythm.   No murmur heard. Pulmonary/Chest: Effort normal and breath sounds normal. No respiratory distress.  Abdominal: Soft. There is no tenderness.  Musculoskeletal: Normal range of motion. She exhibits tenderness. She exhibits no edema.  Right toe  Partially avulsed nail    Neurological: She is alert.  Skin: Skin is warm and dry.  Psychiatric: She has a normal mood and affect.  Nursing note and vitals reviewed.    ED Treatments / Results  Labs (all labs ordered are listed, but only abnormal results are displayed) Labs Reviewed  BASIC METABOLIC PANEL - Abnormal; Notable for the following:       Result Value   Glucose, Bld 102 (*)    All other components within normal limits  CBC - Abnormal; Notable for the following:    RBC 5.50 (*)    MCV 68.9 (*)    MCH 22.2 (*)    All other components  within normal limits  TROPONIN I    EKG  EKG Interpretation  Date/Time:  Wednesday July 19 2016 09:58:25 EST Ventricular Rate:  86 PR Interval:    QRS Duration: 79 QT Interval:  375 QTC Calculation: 449 R Axis:   47 Text Interpretation:  Sinus rhythm Borderline short PR interval LAE, consider biatrial enlargement Borderline repolarization abnormality No significant change since last tracing Confirmed by Lincoln Brigham [phone]) on 07/19/2016 10:05:28 AM       Radiology Dg Chest 2 View  Result Date: 07/19/2016 CLINICAL DATA:  36 year old female with chest pain and shortness of breath for 1 month. Initial encounter. EXAM: CHEST  2 VIEW COMPARISON:  Chest radiographs 05/11/2010. FINDINGS: Large body habitus. Lung volumes remain normal.  Mediastinal contours remain normal. Visualized tracheal air column is within normal limits. The lungs are clear. No pneumothorax or pleural effusion. No osseous abnormality identified. IMPRESSION: Negative.  No acute cardiopulmonary abnormality. Electronically Signed   By: Odessa Fleming M.D.   On: 07/19/2016 10:42   Dg Toe Great Right  Result Date: 07/19/2016 CLINICAL DATA:  36 year old female status post blunt trauma to the right great toe today with pain and swelling. Initial encounter. EXAM: RIGHT GREAT TOE COMPARISON:  None. FINDINGS: Age advanced degenerative changes at the first MTP joint with joint space loss, subchondral sclerosis, osteophytosis, and also chronic appearing fragmentation laterally (image 1). The first metatarsal appears intact. The great toe phalanges appear intact. No acute fracture or dislocation identified. Underlying bone mineralization is normal. IMPRESSION: No acute fracture or dislocation. 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 Electronically Signed   By: Odessa Fleming M.D.   On: 07/19/2016 10:44    Procedures Procedures (including critical care time)  Medications Ordered in ED Medications - No data to display   Initial Impression / Assessment and Plan / ED Course  I have reviewed the triage vital signs and the nursing notes.  Pertinent labs & imaging results that were available during my care of the patient were reviewed by me and considered in my medical decision making (see chart for details).  Clinical Course     No fracture toe bandaged.  Pt advised she needs to follow up with cardiology.    Final Clinical Impressions(s) / ED Diagnoses   Final diagnoses:  Chronic chest pain  Injury of right toe, initial encounter    New Prescriptions Discharge Medication List as of 07/19/2016 11:40 AM     Bandage.  An After Visit Summary was printed and given to the patient.   Lonia [place], PA-C 07/19/16 1629    Tilden Fossa, MD 07/20/16  [phone]

## 2016-07-19 NOTE — ED Triage Notes (Signed)
Pt reports R great toe pain with bleeding after accidentally hitting it on a weight this morning. Pt states since she was coming for that, she may as well get the chest pain evaluated as well. Pt reports chest pain that moves between right and left chest x 2 months which is exacerbated by "moving a lot". Denies SOB.

## 2016-07-19 NOTE — Discharge Instructions (Signed)
Schedule appointment to see the cardiologist for recheck.   Take an aspirin 81 mg a day.  Toe nail will fall off.  Tape down for as long as possible to preserve nailbed.

## 2016-07-25 DIAGNOSIS — Z6841 Body Mass Index (BMI) 40.0 and over, adult: Secondary | ICD-10-CM | POA: Insufficient documentation

## 2016-07-25 HISTORY — DX: Body mass index (BMI) 50.0-59.9, adult: E66.01

## 2017-05-20 ENCOUNTER — Other Ambulatory Visit: Payer: Self-pay

## 2017-05-20 ENCOUNTER — Emergency Department (HOSPITAL_BASED_OUTPATIENT_CLINIC_OR_DEPARTMENT_OTHER)
Admission: EM | Admit: 2017-05-20 | Discharge: 2017-05-20 | Disposition: A | Discharge status: Home / Self Care | Payer: Self-pay | Attending: Emergency Medicine | Admitting: Emergency Medicine

## 2017-05-20 ENCOUNTER — Encounter (HOSPITAL_BASED_OUTPATIENT_CLINIC_OR_DEPARTMENT_OTHER): Payer: Self-pay | Admitting: *Deleted

## 2017-05-20 DIAGNOSIS — Z79899 Other long term (current) drug therapy: Secondary | ICD-10-CM | POA: Insufficient documentation

## 2017-05-20 DIAGNOSIS — F17200 Nicotine dependence, unspecified, uncomplicated: Secondary | ICD-10-CM | POA: Insufficient documentation

## 2017-05-20 DIAGNOSIS — R519 Headache, unspecified: Secondary | ICD-10-CM

## 2017-05-20 DIAGNOSIS — R51 Headache: Secondary | ICD-10-CM | POA: Insufficient documentation

## 2017-05-20 LAB — URINALYSIS, ROUTINE W REFLEX MICROSCOPIC
BILIRUBIN URINE: NEGATIVE
Glucose, UA: NEGATIVE mg/dL
Hgb urine dipstick: NEGATIVE
Ketones, ur: NEGATIVE mg/dL
LEUKOCYTES UA: NEGATIVE
NITRITE: NEGATIVE
PH: 6 (ref 5.0–8.0)
Protein, ur: NEGATIVE mg/dL
SPECIFIC GRAVITY, URINE: 1.02 (ref 1.005–1.030)

## 2017-05-20 LAB — PREGNANCY, URINE: PREG TEST UR: NEGATIVE

## 2017-05-20 MED ORDER — BUTALBITAL-APAP-CAFFEINE 50-325-40 MG PO TABS: 1.0000 | ORAL_TABLET | Freq: Four times a day (QID) | ORAL | 0 refills | Status: AC | PRN

## 2017-05-20 NOTE — ED Provider Notes (Signed)
[facility] EMERGENCY DEPARTMENT Provider Note   CSN: [id number] Arrival date & time: 05/20/17  1128     History   Chief Complaint Chief Complaint  Patient presents with  . Nausea    HPI Victoria Melendez is a 36 y.o. female.  HPI   36 year old morbidly obese female with history of paroxysmal atrial fibrillation presenting for evaluation of headache.  Patient report for the past 2-3 days she has had intermittent headache.  She described the headache as a sharp throbbing sensation across her forehead, moderate in intensity.  She endorsed nausea without vomiting.  Her nausea has resolved while waiting.  She denies any associated fever, chills, diplopia, vision changes, spots in her vision, URI symptoms, neck stiffness, confusion, chest pain, trouble breathing, focal numbness or weakness, or rash.  Headache felt similar to headaches that she had in the past.  She did try taking some Tylenol with minimal improvement.  Past Medical History:  Diagnosis Date  . Morbid obesity (HCC)   . Paroxysmal A-fib (HCC)     There are no active problems to display for this patient.   History reviewed. No pertinent surgical history.  OB History    No data available       Home Medications    Prior to Admission medications   Medication Sig Start Date End Date Taking? Authorizing Provider  atenolol (TENORMIN) 25 MG tablet Take 50 mg daily by mouth.     [provider]    Family History History reviewed. No pertinent family history.  Social History Social History   Tobacco Use  . Smoking status: Current Some Day Smoker  . Smokeless tobacco: Never Used  Substance Use Topics  . Alcohol use: Yes    Comment: socially  . Drug use: No     Allergies   Patient has no known allergies.   Review of Systems Review of Systems  All other systems reviewed and are negative.    Physical Exam Updated Vital Signs BP (!) 144/107 (BP Location: Left Arm)   Pulse 74   Temp  98.1 F (36.7 C) (Oral)   Resp 20   Ht 5\' 1"  (1.549 m)   Wt 131.5 kg (290 lb)   LMP 05/13/2017   SpO2 99%   BMI 54.80 kg/m   Physical Exam  Constitutional: She is oriented to person, place, and time. She appears well-developed and well-nourished. No distress.  Moderately obese female resting in bed in no acute discomfort, nontoxic in appearance  HENT:  Head: Atraumatic.  Right Ear: External ear normal.  Left Ear: External ear normal.  Mouth/Throat: Oropharynx is clear and moist.  Eyes: Conjunctivae and EOM are normal. Pupils are equal, round, and reactive to light.  Neck: Normal range of motion. Neck supple.  No nuchal rigidity  Cardiovascular: Normal rate and regular rhythm.  Pulmonary/Chest: Effort normal and breath sounds normal.  Neurological: She is alert and oriented to person, place, and time.  Neurologic exam:  Speech clear, pupils equal round reactive to light, extraocular movements intact  Normal peripheral visual fields Cranial nerves III through XII normal including no facial droop Follows commands, moves all extremities x4, normal strength to bilateral upper and lower extremities at all major muscle groups including grip Sensation normal to light touch  Coordination intact, no limb ataxia, finger-nose-finger normal Rapid alternating movements normal No pronator drift Gait normal   Skin: No rash noted.  Psychiatric: She has a normal mood and affect.  Nursing note and vitals reviewed.  ED Treatments / Results  Labs (all labs ordered are listed, but only abnormal results are displayed) Labs Reviewed  URINALYSIS, ROUTINE W REFLEX MICROSCOPIC - Abnormal; Notable for the following components:      Result Value   Color, Urine STRAW (*)    All other components within normal limits  PREGNANCY, URINE    EKG  EKG Interpretation None       Radiology No results found.  Procedures Procedures (including critical care time)  Medications Ordered in  ED Medications - No data to display   Initial Impression / Assessment and Plan / ED Course  I have reviewed the triage vital signs and the nursing notes.  Pertinent labs & imaging results that were available during my care of the patient were reviewed by me and considered in my medical decision making (see chart for details).     BP (!) 144/107 (BP Location: Left Arm)   Pulse 74   Temp 98.1 F (36.7 C) (Oral)   Resp 20   Ht 5\' 1"  (1.549 m)   Wt 131.5 kg (290 lb)   LMP 05/13/2017   SpO2 99%   BMI 54.80 kg/m    Final Clinical Impressions(s) / ED Diagnoses   Final diagnoses:  Bad headache    New Prescriptions This SmartLink is deprecated. Use AVSMEDLIST instead to display the medication list for a patient.   Headache similar to previous, no fever, neck stiffness, neuro findings or new symptoms to suggest more serious etiology.  I don't think SAH, ICH, meningitis, encephalitis, mass at this time.  No recent trauma.  I don't feel imaging necessary at this time.  I offer migraine cocktail.  Pt opted for headache medication for home.      Fayrene Helperran, Philipp Callegari, PA-C 05/20/17 1339    Vanetta MuldersZackowski, Scott, MD 05/23/17 [phone]

## 2017-05-20 NOTE — ED Triage Notes (Signed)
HA, nausea, HTN x 2 days. Hx of same.

## 2017-05-22 ENCOUNTER — Other Ambulatory Visit (HOSPITAL_COMMUNITY): Payer: Self-pay | Admitting: *Deleted

## 2017-07-24 DIAGNOSIS — G43909 Migraine, unspecified, not intractable, without status migrainosus: Secondary | ICD-10-CM

## 2017-07-24 DIAGNOSIS — L219 Seborrheic dermatitis, unspecified: Secondary | ICD-10-CM | POA: Insufficient documentation

## 2017-07-24 HISTORY — DX: Seborrheic dermatitis, unspecified: L21.9

## 2017-07-24 HISTORY — DX: Migraine, unspecified, not intractable, without status migrainosus: G43.909

## 2017-08-15 IMAGING — DX DG CHEST 2V
2 series · 2 of 2 positions shown · non-contrast
Comparison: Chest radiographs 05/11/2010.

CLINICAL DATA: 35-year-old female with chest pain and shortness of
breath for 1 month. Initial encounter.

EXAM:
CHEST  2 VIEW

[chest pa]
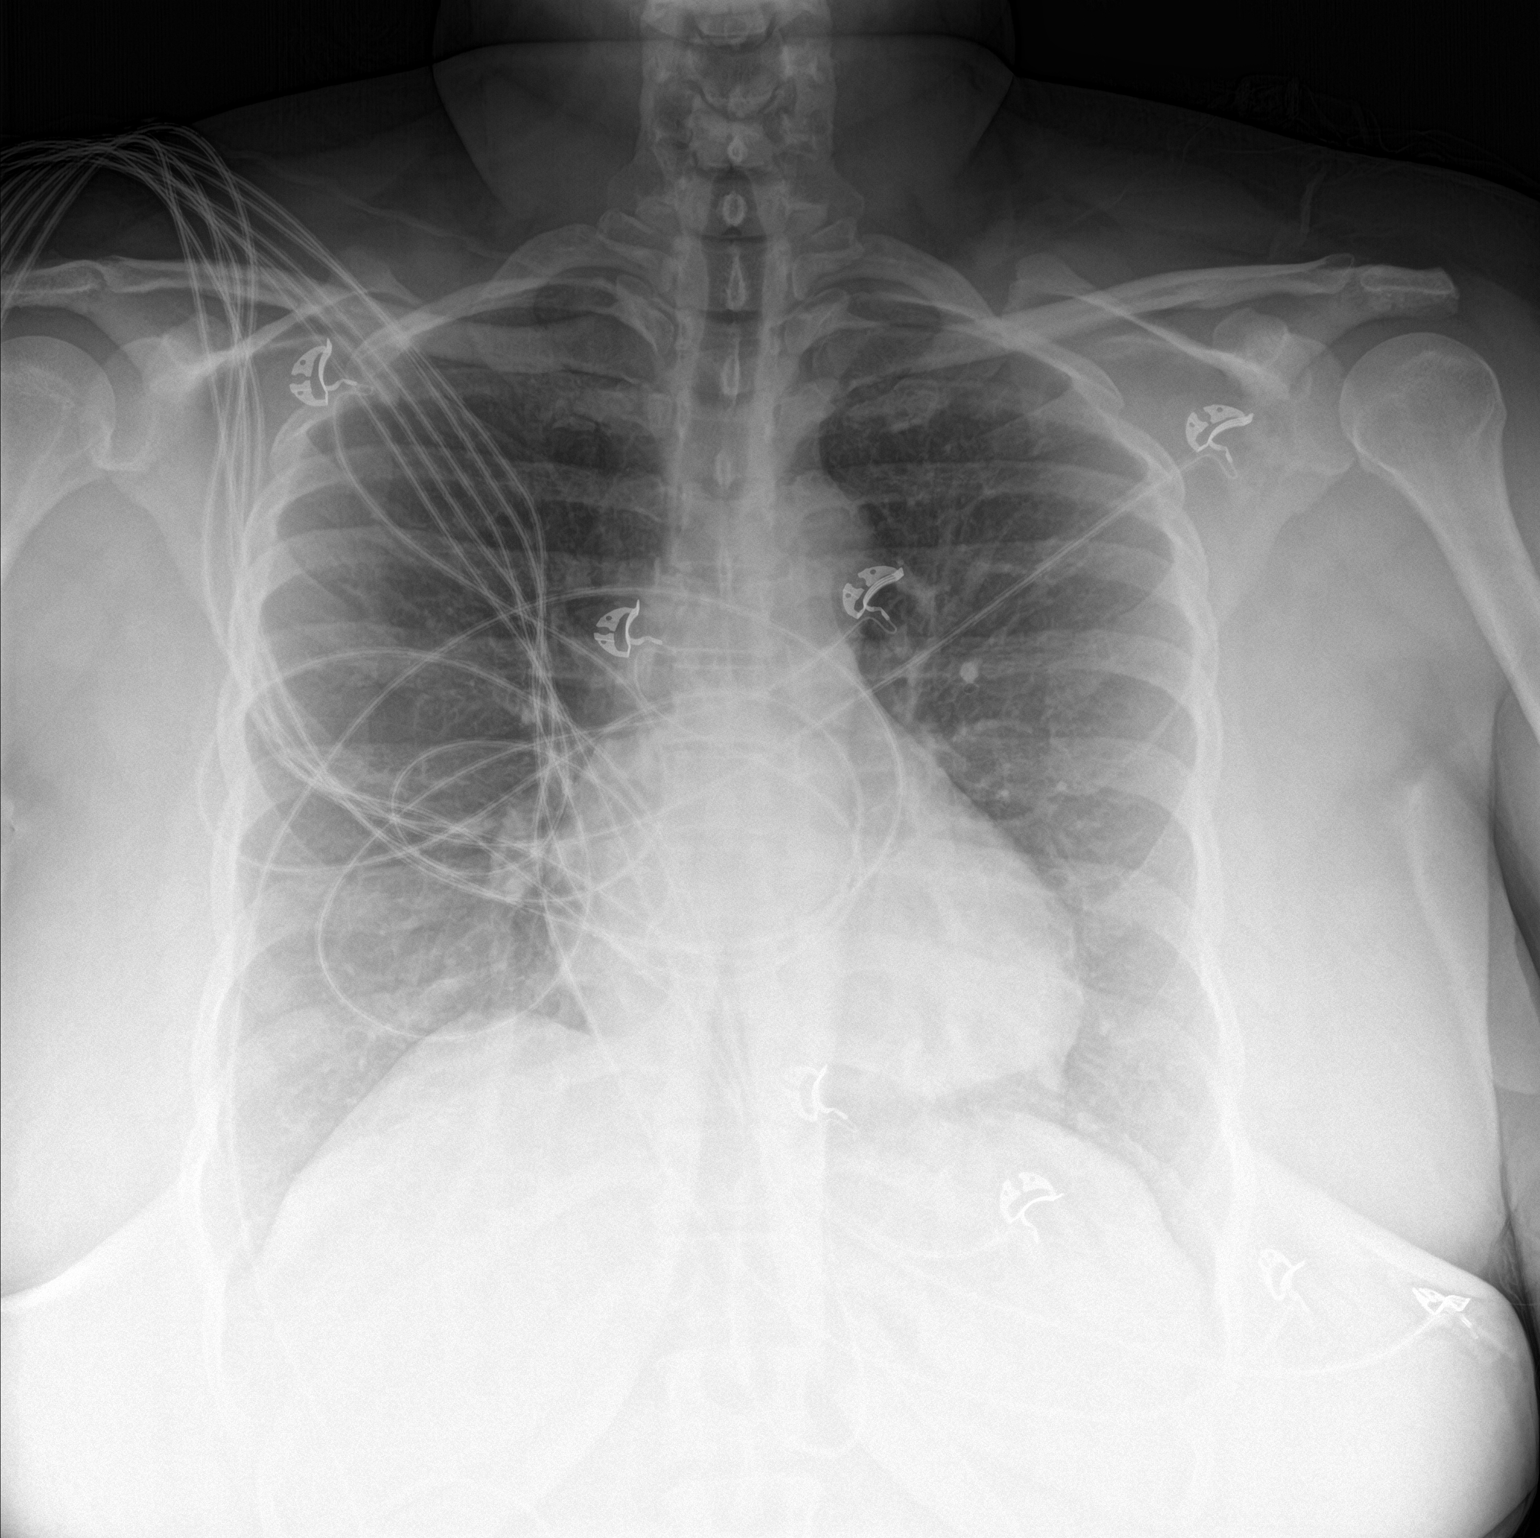

[chest lat]
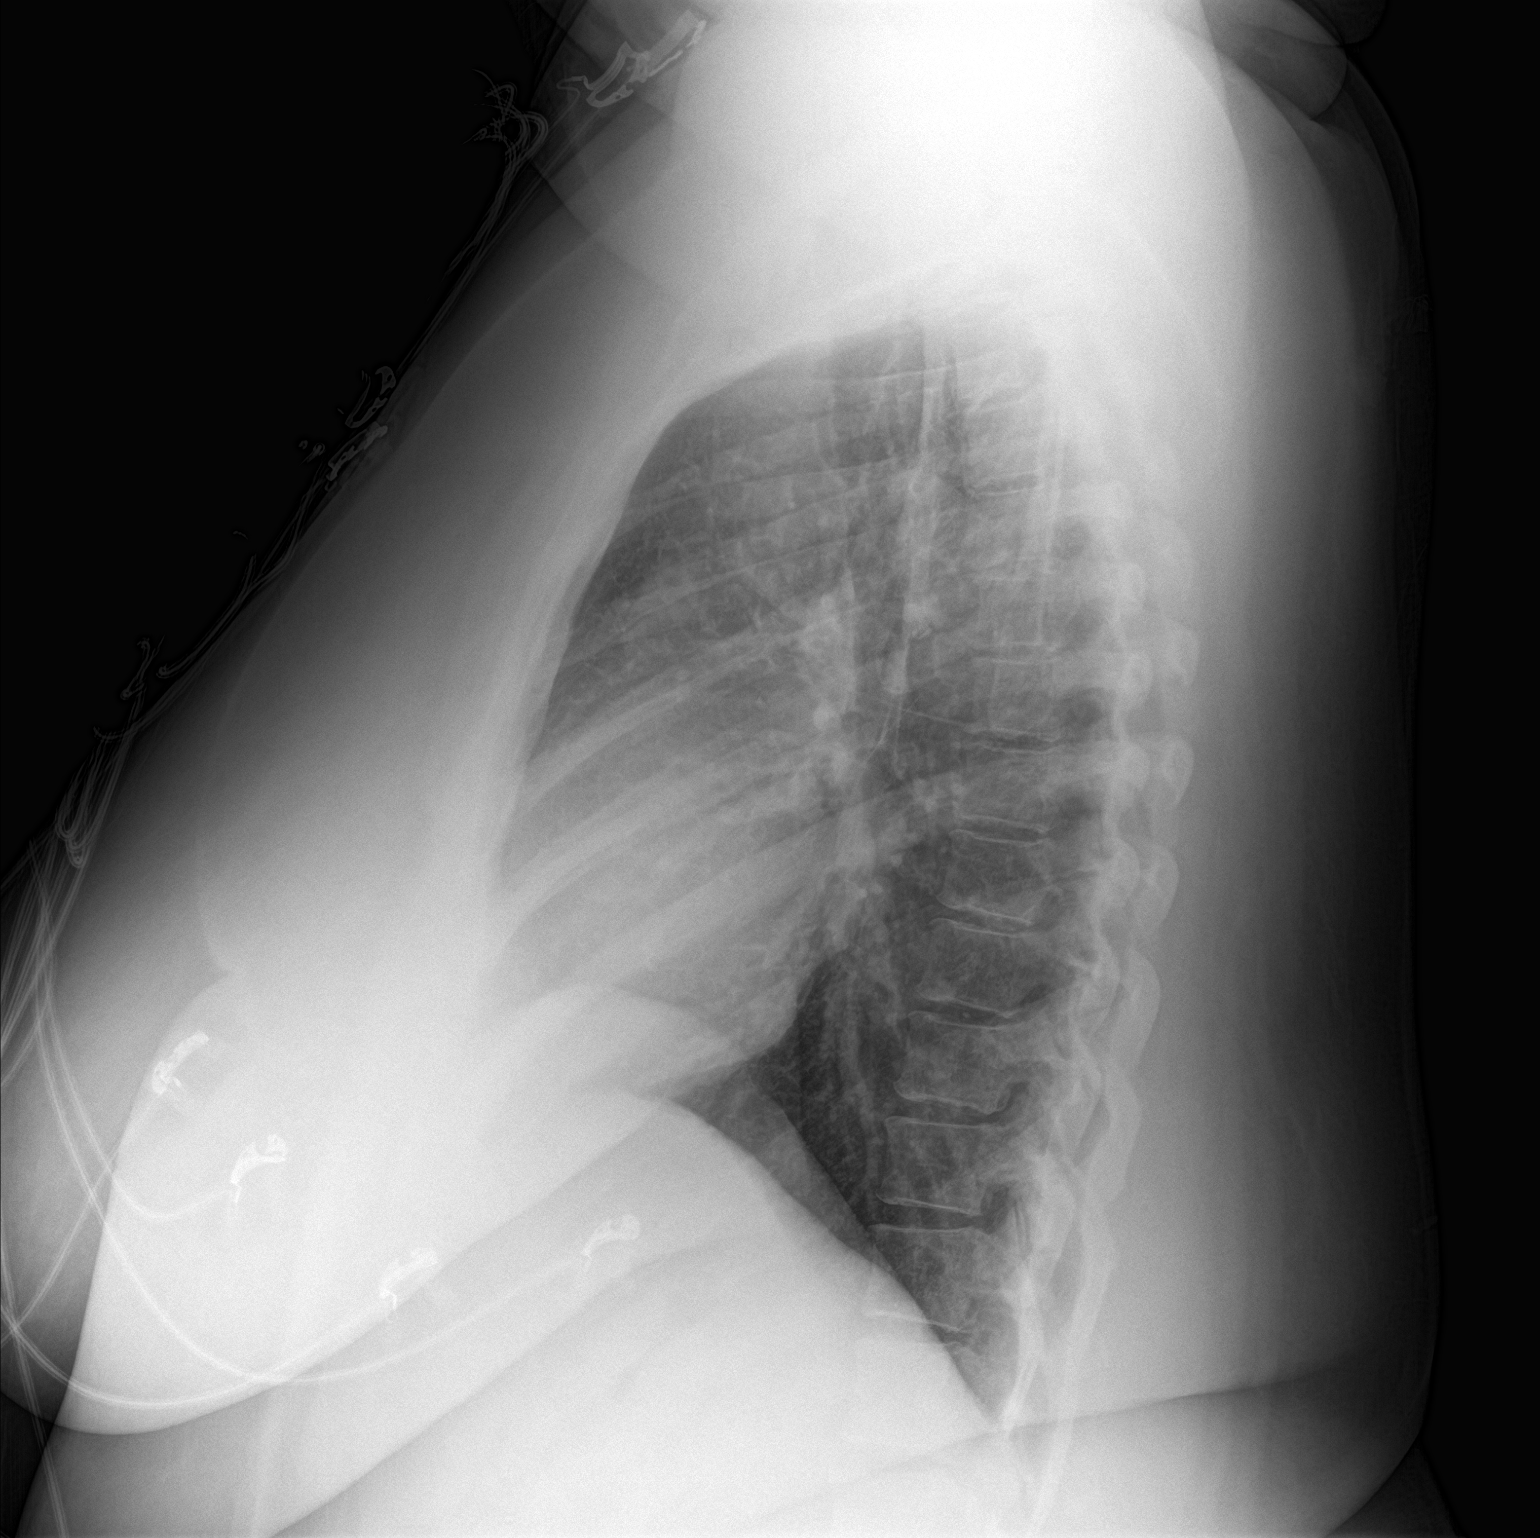

[2 of 2 positions shown; findings below may reference images not displayed]

FINDINGS: Large body habitus. Lung volumes remain normal. Mediastinal contours
remain normal. Visualized tracheal air column is within normal
limits. The lungs are clear. No pneumothorax or pleural effusion. No
osseous abnormality identified.
IMPRESSION: Negative.  No acute cardiopulmonary abnormality.

## 2019-11-04 ENCOUNTER — Encounter (HOSPITAL_BASED_OUTPATIENT_CLINIC_OR_DEPARTMENT_OTHER): Payer: Self-pay

## 2019-11-04 ENCOUNTER — Emergency Department (HOSPITAL_BASED_OUTPATIENT_CLINIC_OR_DEPARTMENT_OTHER)
Admission: EM | Admit: 2019-11-04 | Discharge: 2019-11-04 | Disposition: A | Discharge status: Home / Self Care | Payer: Medicaid Other | Attending: Emergency Medicine | Admitting: Emergency Medicine

## 2019-11-04 ENCOUNTER — Emergency Department (HOSPITAL_BASED_OUTPATIENT_CLINIC_OR_DEPARTMENT_OTHER): Payer: Medicaid Other

## 2019-11-04 ENCOUNTER — Other Ambulatory Visit: Payer: Self-pay

## 2019-11-04 DIAGNOSIS — Z20822 Contact with and (suspected) exposure to covid-19: Secondary | ICD-10-CM | POA: Diagnosis not present

## 2019-11-04 DIAGNOSIS — R059 Cough, unspecified: Secondary | ICD-10-CM

## 2019-11-04 DIAGNOSIS — I480 Paroxysmal atrial fibrillation: Secondary | ICD-10-CM | POA: Insufficient documentation

## 2019-11-04 DIAGNOSIS — I10 Essential (primary) hypertension: Secondary | ICD-10-CM | POA: Diagnosis not present

## 2019-11-04 DIAGNOSIS — Z79899 Other long term (current) drug therapy: Secondary | ICD-10-CM | POA: Diagnosis not present

## 2019-11-04 DIAGNOSIS — R05 Cough: Secondary | ICD-10-CM | POA: Insufficient documentation

## 2019-11-04 DIAGNOSIS — R0789 Other chest pain: Secondary | ICD-10-CM | POA: Diagnosis present

## 2019-11-04 DIAGNOSIS — R5383 Other fatigue: Secondary | ICD-10-CM | POA: Insufficient documentation

## 2019-11-04 DIAGNOSIS — F17210 Nicotine dependence, cigarettes, uncomplicated: Secondary | ICD-10-CM | POA: Diagnosis not present

## 2019-11-04 LAB — CBC
HCT: 38.3 % (ref 36.0–46.0)
Hemoglobin: 12.1 g/dL (ref 12.0–15.0)
MCH: 23 pg — ABNORMAL LOW (ref 26.0–34.0)
MCHC: 31.6 g/dL (ref 30.0–36.0)
MCV: 72.8 fL — ABNORMAL LOW (ref 80.0–100.0)
Platelets: 198 10*3/uL (ref 150–400)
RBC: 5.26 MIL/uL — ABNORMAL HIGH (ref 3.87–5.11)
RDW: 15.2 % (ref 11.5–15.5)
WBC: 9.9 10*3/uL (ref 4.0–10.5)
nRBC: 0 % (ref 0.0–0.2)

## 2019-11-04 LAB — BASIC METABOLIC PANEL
Anion gap: 11 (ref 5–15)
BUN: 8 mg/dL (ref 6–20)
CO2: 23 mmol/L (ref 22–32)
Calcium: 8.8 mg/dL — ABNORMAL LOW (ref 8.9–10.3)
Chloride: 102 mmol/L (ref 98–111)
Creatinine, Ser: 0.7 mg/dL (ref 0.44–1.00)
GFR calc Af Amer: >60 mL/min (ref >60)
GFR calc non Af Amer: >60 mL/min (ref >60)
Glucose, Bld: 114 mg/dL — ABNORMAL HIGH (ref 70–99)
Potassium: 3.5 mmol/L (ref 3.5–5.1)
Sodium: 136 mmol/L (ref 135–145)

## 2019-11-04 LAB — PREGNANCY, URINE: Preg Test, Ur: NEGATIVE

## 2019-11-04 LAB — TROPONIN I (HIGH SENSITIVITY): Troponin I (High Sensitivity): 6 ng/L (ref <18)

## 2019-11-04 MED ORDER — BENZONATATE 100 MG PO CAPS: 100.0000 mg | ORAL_CAPSULE | Freq: Three times a day (TID) | ORAL | 0 refills | Status: DC

## 2019-11-04 NOTE — ED Triage Notes (Signed)
Pt states that she has been having CP with a cough and legs aching for 2-3 days. Denies fever. CP is central, non radiating.

## 2019-11-04 NOTE — Discharge Instructions (Addendum)
Take the medication as prescribed.  If you have any new or worsening symptoms please seek reevaluation emergency department.  You were tested for Covid.  You will use at work until this test has resulted.  If positive you will need to stay out of work for 7 days from symptom onset

## 2019-11-04 NOTE — ED Provider Notes (Signed)
[facility] EMERGENCY DEPARTMENT Provider Note   CSN: [id number] Arrival date & time: 11/04/19  1626    History Chief Complaint  Patient presents with  . Chest Pain   Victoria Melendez is a 39 y.o. female with past medical history significant for morbid obesity, HTN, one episode of A. fib in 2017 not currently followed by cardiology who presents for evaluation of cough with chest pain.  Patient states she developed a nonproductive cough over the last 3 days.  Since then she has developed central chest pain.  Pain occurs when she coughs.  She has no exertional or pleuritic chest pain.  Does not radiate to her left arm left jaw or back. No associated diaphoresis, nausea, vomiting.  No unilateral leg swelling redness or warmth.  No history of OCPs, recent travel, immobilization or surgery.  No family history of personal history of clotting disorders. No hx of heart failure.  Recent Covid exposures.  Denies aggravating or alleviating factors.  No fever, chills, nausea, vomiting, lightheadedness, dizziness, abdominal pain, diarrhea, dysuria.  Has not take anything for symptoms.  Denies aggravating or alleviating factors. Denies PND, Orthopnea.  Triage note notes achiness to bilateral lower extremities.  I discussed this with patient.  Patient states she has allover myalgias.  This is not solely isolated to her lower extremities.  History obtained from patient and past medical records. No interpretor was used.  HPI     Past Medical History:  Diagnosis Date  . Morbid obesity (HCC)   . Paroxysmal A-fib (HCC)     There are no problems to display for this patient.   History reviewed. No pertinent surgical history.   OB History   No obstetric history on file.     No family history on file.  Social History   Tobacco Use  . Smoking status: Current Some Day Smoker  . Smokeless tobacco: Never Used  Substance Use Topics  . Alcohol use: Yes    Comment: socially  . Drug use: No     Home Medications Prior to Admission medications   Medication Sig Start Date End Date Taking? Authorizing Provider  atenolol (TENORMIN) 25 MG tablet Take 50 mg daily by mouth.     [provider]  benzonatate (TESSALON) 100 MG capsule Take 1 capsule (100 mg total) by mouth every 8 (eight) hours. 11/04/19   Juanya Villavicencio A, PA-C    Allergies    Patient has no known allergies.  Review of Systems   Review of Systems  Constitutional: Positive for fatigue.  HENT: Positive for congestion and rhinorrhea.   Respiratory: Positive for cough. Negative for apnea, choking, chest tightness, shortness of breath, wheezing and stridor.   Cardiovascular: Positive for chest pain (With cough). Negative for palpitations and leg swelling.  Gastrointestinal: Negative.   Genitourinary: Negative.   Musculoskeletal: Negative.   Skin: Negative.   Neurological: Negative.   All other systems reviewed and are negative.   Physical Exam Updated Vital Signs BP (!) 161/95 (BP Location: Right Arm)   Pulse 74   Temp 98.8 F (37.1 C) (Oral)   Resp (!) 26   Ht 5\' 1"  (1.549 m)   Wt 131.5 kg   LMP 10/16/2019   SpO2 98%   BMI 54.80 kg/m   Physical Exam Vitals and nursing note reviewed.  Constitutional:      General: She is not in acute distress.    Appearance: She is obese. She is not ill-appearing, toxic-appearing or diaphoretic.  HENT:  Head: Normocephalic and atraumatic.     Jaw: There is normal jaw occlusion.     Right Ear: Tympanic membrane, ear canal and external ear normal. There is no impacted cerumen. No hemotympanum. Tympanic membrane is not injected, scarred, perforated, erythematous, retracted or bulging.     Left Ear: Tympanic membrane, ear canal and external ear normal. There is no impacted cerumen. No hemotympanum. Tympanic membrane is not injected, scarred, perforated, erythematous, retracted or bulging.     Ears:     Comments: No Mastoid tenderness.    Nose:      Comments: Clear rhinorrhea and congestion to bilateral nares.  No sinus tenderness.    Mouth/Throat:     Comments: Posterior oropharynx clear.  Mucous membranes moist.  Tonsils without erythema or exudate.  Uvula midline without deviation.  No evidence of PTA or RPA.  No drooling, dysphasia or trismus.  Phonation normal. Neck:     Trachea: Trachea and phonation normal.     Meningeal: Brudzinski's sign and Kernig's sign absent.     Comments: No Neck stiffness or neck rigidity.  No meningismus.  No cervical lymphadenopathy. Cardiovascular:     Rate and Rhythm: Normal rate.     Pulses:          Radial pulses are 2+ on the right side and 2+ on the left side.     Heart sounds: Normal heart sounds.     Comments: No murmurs rubs or gallops. Pulmonary:     Effort: Pulmonary effort is normal.     Breath sounds: Normal breath sounds.     Comments: Clear to auscultation bilaterally without wheeze, rhonchi or rales.  No accessory muscle usage.  Able speak in full sentences. Chest:    Abdominal:     Comments: Soft, nontender without rebound or guarding.  No CVA tenderness.  Musculoskeletal:     Comments: Moves all 4 extremities without difficulty.  Lower extremities without erythema or warmth. Trace pitting edema up to mid shin. Homans sign negative  Skin:    Comments: Brisk capillary refill.  No rashes or lesions.  Neurological:     Mental Status: She is alert.     Comments: Ambulatory in department without difficulty.  Cranial nerves II through XII grossly intact.  No facial droop.  No aphasia.    ED Results / Procedures / Treatments   Labs (all labs ordered are listed, but only abnormal results are displayed) Labs Reviewed  BASIC METABOLIC PANEL - Abnormal; Notable for the following components:      Result Value   Glucose, Bld 114 (*)    Calcium 8.8 (*)    All other components within normal limits  CBC - Abnormal; Notable for the following components:   RBC 5.26 (*)    MCV 72.8 (*)     MCH 23.0 (*)    All other components within normal limits  SARS CORONAVIRUS 2 (TAT 6-24 HRS)  PREGNANCY, URINE  TROPONIN I (HIGH SENSITIVITY)  TROPONIN I (HIGH SENSITIVITY)    EKG EKG Interpretation  Date/Time:  Tuesday November 04 2019 16:33:24 EDT Ventricular Rate:  77 PR Interval:  116 QRS Duration: 74 QT Interval:  360 QTC Calculation: 407 R Axis:   55 Text Interpretation: Normal sinus rhythm Nonspecific T wave abnormality Abnormal ECG No significant change since last tracing Confirmed by Theotis Burrow [phone]) on 11/04/2019 5:26:40 PM   Radiology DG Chest 2 View  Result Date: 11/04/2019 CLINICAL DATA:  Chest pain and cough. EXAM: CHEST -  2 VIEW COMPARISON:  07/19/2016 FINDINGS: There is new mild cardiomegaly as compared to the prior study. The pulmonary vascularity is normal. Lungs are clear. No effusions. No bone abnormality. IMPRESSION: New mild cardiomegaly. Electronically Signed   By: Francene Boyers M.D.   On: 11/04/2019 17:24   Procedures Procedures (including critical care time)  Medications Ordered in ED Medications - No data to display  ED Course  I have reviewed the triage vital signs and the nursing notes.  Pertinent labs & imaging results that were available during my care of the patient were reviewed by me and considered in my medical decision making (see chart for details).  43 old female appears otherwise well presents for evaluation of cough and chest pain.  She is afebrile, nonseptic, not ill-appearing.  I am able to reproduce her chest pain on exam.  Seems musculoskeletal in nature.  She has no pleuritic or exertional chest pain.  Admits to body aches and pains, congestion, rhinorrhea as well as cough.  She is without tachycardia or hypoxia.  She is afebrile.  No evidence of DVT on exam.  She does have trace pitting edema to lower extremities which patient states is at her baseline.  Denies any PND orthopnea.  She is not on any denies hormone use, no recent  surgery, immobilization.  Denna Haggard' sign negative.  She is Wells criteria low risk, PERC negative.  Her symptoms do not seem ACS related.  I have low suspicion for this, dissection, myocarditis or pericarditis.  Does have remote history of an episode of paroxysmal A. fib in 2017 likely thought related to holiday heart.  She has not had any further palpitations or episodes of A. fib.  She is not currently followed by cardiology.  Labs and imaging personally reviewed and interpreted which were obtained from triage EKG without STEMI, no change from previous Pregnancy test negative CBC without leukocytosis Troponin 6, given timing of symptoms I have low suspicion for ACS, is likely related to ACS would expect elevated troponin at this time, do not feel we need  delta troponin at this time. Metabolic panel with mild hyperglycemia at 114 Plan film chest without any evidence of infiltrates, she does have mild cardiomegaly however no pulmonary edema.  No pneumothorax.  Patient reassessed.  Continues to be without any tachycardia or hypoxia.  Will obtain Covid testing.  Patient with heart score 2- Risk factors.  Triage note does note pain to her lower extremities however patient states this is achiness to bilateral upper and lower extremities.  Not solely located to her lower extremities.  I have low suspicion for DVT, infectious process as cause of this.  Patient does have chads vas score of 2.  Per her prior cardiology note they did not want her to start on anticoagulation due to her single episode of A. fib.  Will have patient refer back to her cardiologist for anticoagulation discussion, not feel we need to start this at this time.  Also need to follow-up with her cardiologist for her mild cardiomegaly.  Patient is to be discharged with recommendation to follow up with PCP in regards to today's hospital visit. VSS, no tracheal deviation, no JVD or new murmur, RRR, breath sounds equal bilaterally, EKG without  acute abnormalities, negative troponin. Pt has been advised to return to the ED if CP becomes exertional, associated with diaphoresis or nausea, radiates to left jaw/arm, worsens or becomes concerning in any way. Pt appears reliable for follow up and is agreeable to  discharge.   The patient has been appropriately medically screened and/or stabilized in the ED. I have low suspicion for any other emergent medical condition which would require further screening, evaluation or treatment in the ED or require inpatient management.  Patient is hemodynamically stable and in no acute distress.  Patient able to ambulate in department prior to ED.  Evaluation does not show acute pathology that would require ongoing or additional emergent interventions while in the emergency department or further inpatient treatment.  I have discussed the diagnosis with the patient and answered all questions.  Pain is been managed while in the emergency department and patient has no further complaints prior to discharge.  Patient is comfortable with plan discussed in room and is stable for discharge at this time.  I have discussed strict return precautions for returning to the emergency department.  Patient was encouraged to follow-up with PCP/specialist refer to at discharge.    MDM Rules/Calculators/A&P     CHA2DS2-VASc Score: 2                  Final Clinical Impression(s) / ED Diagnoses Final diagnoses:  Cough  Chest wall pain  COVID-19 virus test result unknown    Rx / DC Orders ED Discharge Orders         Ordered    benzonatate (TESSALON) 100 MG capsule  Every 8 hours     11/04/19 1813           Godric Lavell A, PA-C 11/04/19 1817    Little, Ambrose [place], MD 11/04/19 1910

## 2019-11-04 NOTE — ED Notes (Signed)
Pt ambulated to bathroom without difficulty.

## 2019-11-05 ENCOUNTER — Other Ambulatory Visit (HOSPITAL_COMMUNITY): Payer: Self-pay | Admitting: *Deleted

## 2019-11-05 DIAGNOSIS — I517 Cardiomegaly: Secondary | ICD-10-CM

## 2019-11-05 LAB — SARS CORONAVIRUS 2 (TAT 6-24 HRS): SARS Coronavirus 2: NEGATIVE

## 2019-11-06 ENCOUNTER — Encounter (HOSPITAL_BASED_OUTPATIENT_CLINIC_OR_DEPARTMENT_OTHER): Payer: Self-pay | Admitting: *Deleted

## 2019-11-06 ENCOUNTER — Emergency Department (HOSPITAL_BASED_OUTPATIENT_CLINIC_OR_DEPARTMENT_OTHER)
Admission: EM | Admit: 2019-11-06 | Discharge: 2019-11-06 | Disposition: A | Discharge status: Home / Self Care | Payer: Medicaid Other | Attending: Emergency Medicine | Admitting: Emergency Medicine

## 2019-11-06 ENCOUNTER — Other Ambulatory Visit: Payer: Self-pay

## 2019-11-06 DIAGNOSIS — Z7984 Long term (current) use of oral hypoglycemic drugs: Secondary | ICD-10-CM | POA: Diagnosis not present

## 2019-11-06 DIAGNOSIS — R059 Cough, unspecified: Secondary | ICD-10-CM

## 2019-11-06 DIAGNOSIS — F17200 Nicotine dependence, unspecified, uncomplicated: Secondary | ICD-10-CM | POA: Diagnosis not present

## 2019-11-06 DIAGNOSIS — R05 Cough: Secondary | ICD-10-CM | POA: Diagnosis present

## 2019-11-06 DIAGNOSIS — J40 Bronchitis, not specified as acute or chronic: Secondary | ICD-10-CM | POA: Insufficient documentation

## 2019-11-06 DIAGNOSIS — Z6841 Body Mass Index (BMI) 40.0 and over, adult: Secondary | ICD-10-CM | POA: Insufficient documentation

## 2019-11-06 DIAGNOSIS — Z79899 Other long term (current) drug therapy: Secondary | ICD-10-CM | POA: Insufficient documentation

## 2019-11-06 MED ORDER — PROMETHAZINE-DM 6.25-15 MG/5ML PO SYRP: 1.2500 mL | ORAL_SOLUTION | Freq: Four times a day (QID) | ORAL | 0 refills | Status: DC | PRN

## 2019-11-06 NOTE — Discharge Instructions (Signed)
You are diagnosed with bronchitis.  This is a viral disease and may take 1 to 2 weeks to improve.  Please continue to use Tessalon Perles for cough.  You may use the Promethazine DM cough syrup at night.  This should help with your cough.  Please return if you have any new or concerning symptoms as we discussed.

## 2019-11-06 NOTE — ED Provider Notes (Signed)
[facility] EMERGENCY DEPARTMENT Provider Note   CSN: [id number] Arrival date & time: 11/06/19  1314     History Chief Complaint  Patient presents with  . Cough    Victoria Melendez is a 39 y.o. female.  HPI  Patient is a 39 year old female with no pertinent past medical history apart from morbid obesity and PAF which was remote, one episode in 2017 was cleared by cardiology.  Patient presents today with continued cough for approximately 10 days.  She states that she was seen yesterday and given Jerilynn Som however states that she continues to cough.  She states her chest pain resolved.  Denies any shortness of breath, fevers, chills, nausea, abdominal pain, headache or dizziness.  Patient states that she has been using NyQuil and benzonatate no other medications.  She denies any recent surgery, recent travel, does endorse some hemoptysis when she coughs.  Denies any unilateral leg swelling or prior DVT.     Past Medical History:  Diagnosis Date  . Morbid obesity (HCC)   . Paroxysmal A-fib (HCC)     There are no problems to display for this patient.   History reviewed. No pertinent surgical history.   OB History   No obstetric history on file.     History reviewed. No pertinent family history.  Social History   Tobacco Use  . Smoking status: Current Some Day Smoker  . Smokeless tobacco: Never Used  Substance Use Topics  . Alcohol use: Yes    Comment: socially  . Drug use: No    Home Medications Prior to Admission medications   Medication Sig Start Date End Date Taking? Authorizing Provider  liraglutide (VICTOZA) 18 MG/3ML SOPN Inject into the skin. 09/18/18  Yes [provider]  Misc. Devices MISC Needles for [place]  Use as directed.  Dispense: #100 x 3 refills 08/26/18  Yes [provider]  atenolol (TENORMIN) 50 MG tablet Take 50 mg by mouth daily. 10/20/19   [provider]  benzonatate (TESSALON) 100 MG capsule  Take 1 capsule (100 mg total) by mouth every 8 (eight) hours. 11/04/19   Henderly, Britni A, PA-C  promethazine-dextromethorphan (PROMETHAZINE-DM) 6.25-15 MG/5ML syrup Take 1.3 mLs by mouth 4 (four) times daily as needed for cough. 11/06/19   Gailen Shelter, PA    Allergies    Patient has no known allergies.  Review of Systems   Review of Systems  Constitutional: Negative for chills and fever.  HENT: Negative for congestion.   Respiratory: Positive for cough. Negative for shortness of breath.   Cardiovascular: Negative for chest pain.  Gastrointestinal: Negative for abdominal distention, diarrhea and nausea.  Genitourinary: Negative for dysuria.  Neurological: Negative for dizziness and headaches.    Physical Exam Updated Vital Signs BP (!) 181/100 (BP Location: Right Arm)   Pulse 75   Temp 98.3 F (36.8 C) (Oral)   Resp 16   Ht 5\' 1"  (1.549 m)   Wt 131.5 kg   LMP 10/16/2019   SpO2 98%   BMI 54.80 kg/m   Physical Exam Vitals and nursing note reviewed.  Constitutional:      General: She is not in acute distress. HENT:     Head: Normocephalic and atraumatic.     Nose: Nose normal.     Mouth/Throat:     Mouth: Mucous membranes are moist.  Eyes:     General: No scleral icterus. Cardiovascular:     Rate and Rhythm: Normal rate and regular rhythm.  Pulses: Normal pulses.     Heart sounds: Normal heart sounds.  Pulmonary:     Effort: Pulmonary effort is normal. No respiratory distress.     Breath sounds: No wheezing.     Comments: Patient is speaking full sentences without difficulty.  Lungs are clear to auscultation bilaterally no wheezes Abdominal:     Palpations: Abdomen is soft.     Tenderness: There is no abdominal tenderness. There is no right CVA tenderness, left CVA tenderness, guarding or rebound.  Musculoskeletal:     Cervical back: Normal range of motion.     Right lower leg: No edema.     Left lower leg: No edema.  Skin:    General: Skin is warm and  dry.     Capillary Refill: Capillary refill takes less than 2 seconds.  Neurological:     Mental Status: She is alert. Mental status is at baseline.  Psychiatric:        Mood and Affect: Mood normal.        Behavior: Behavior normal.     ED Results / Procedures / Treatments   Labs (all labs ordered are listed, but only abnormal results are displayed) Labs Reviewed - No data to display  EKG None  Radiology DG Chest 2 View  Result Date: 11/04/2019 CLINICAL DATA:  Chest pain and cough. EXAM: CHEST - 2 VIEW COMPARISON:  07/19/2016 FINDINGS: There is new mild cardiomegaly as compared to the prior study. The pulmonary vascularity is normal. Lungs are clear. No effusions. No bone abnormality. IMPRESSION: New mild cardiomegaly. Electronically Signed   By: Francene Boyers M.D.   On: 11/04/2019 17:24    Procedures Procedures (including critical care time)  Medications Ordered in ED Medications - No data to display  ED Course  I have reviewed the triage vital signs and the nursing notes.  Pertinent labs & imaging results that were available during my care of the patient were reviewed by me and considered in my medical decision making (see chart for details).    MDM Rules/Calculators/A&P                      Patient is well-appearing 39 year old female with no significant past medical history apart from hypertension and PAF 1 episode that is remote.  Patient presented today with cough.  She was evaluated 2 days ago and had extensive cardiac work-up including chest x-ray, EKG, troponins, basic labs.  She was discharged with Jerilynn Som.  She is returning today because she her cough is continued and Jerilynn Som is not improving her symptoms.  She denies any chest pain, she is PERC negative.  She does have elevated blood pressure which I discussed with her she suspect that this is secondary to her discomfort and frequent coughing.  She states that she did take her blood pressure  medications today she denies any chest pain, shortness of breath, headache, dizziness, lightheadedness, weakness.   Doubt hypertensive urgency/emergency.  Will recheck blood pressure patient states she will follow-up closely with PCP to reevaluate.  Shivangi Lutz was evaluated in Emergency Department on 11/06/2019 for the symptoms described in the history of present illness. She was evaluated in the context of the global COVID-19 pandemic, which necessitated consideration that the patient might be at risk for infection with the SARS-CoV-2 virus that causes COVID-19. Institutional protocols and algorithms that pertain to the evaluation of patients at risk for COVID-19 are in a state of rapid change based on information  released by regulatory bodies including the CDC and federal and state organizations. These policies and algorithms were followed during the patient's care in the ED.  The medical records were personally reviewed by myself. I personally reviewed all lab results and interpreted all imaging studies and either concurred with their official read or contacted radiology for clarification. Additional history obtained from old records  This patient appears reasonably screened and I doubt any other medical condition requiring further workup, evaluation, or treatment in the ED at this time prior to discharge.   Patient's vitals are WNL apart from vital sign abnormalities discussed above, patient is in NAD, and able to ambulate in the ED at their baseline and able to tolerate PO.  Pain has been managed or a plan has been made for home management and has no complaints prior to discharge. Patient is comfortable with above plan and for discharge at this time. All questions were answered prior to disposition. Results from the ER workup discussed with the patient face to face and all questions answered to the best of my ability. The patient is safe for discharge with strict return precautions. Patient  appears safe for discharge with appropriate follow-up. Conveyed my impression with the patient and they voiced understanding and are agreeable to plan.   An After Visit Summary was printed and given to the patient.  Portions of this note were generated with [employer]. Dictation errors may occur despite best attempts at proofreading.   Patient is agreeable to discharge at this time.  Is agreeable to mental work-up given extensive work-up 2 days ago.  She prefer not to have further evaluation.  Final Clinical Impression(s) / ED Diagnoses Final diagnoses:  Cough  Bronchitis    Rx / DC Orders ED Discharge Orders         Ordered    promethazine-dextromethorphan (PROMETHAZINE-DM) 6.25-15 MG/5ML syrup  4 times daily PRN     11/06/19 1438           Pati Gallo [place], [place] 11/06/19 1443    Veryl Speak, MD 11/07/19 1517

## 2019-11-06 NOTE — ED Triage Notes (Signed)
Pt cont with cough x 1 week , seen here x 2 days ago for same , COvid neg

## 2019-11-10 ENCOUNTER — Encounter: Payer: Self-pay | Admitting: Cardiology

## 2019-11-10 ENCOUNTER — Other Ambulatory Visit: Payer: Self-pay

## 2019-11-10 ENCOUNTER — Ambulatory Visit (INDEPENDENT_AMBULATORY_CARE_PROVIDER_SITE_OTHER): Payer: Medicaid Other | Admitting: Cardiology

## 2019-11-10 VITALS — BP 160/110 | HR 64 | Ht 61.0 in | Wt 284.0 lb

## 2019-11-10 DIAGNOSIS — I10 Essential (primary) hypertension: Secondary | ICD-10-CM | POA: Insufficient documentation

## 2019-11-10 DIAGNOSIS — I480 Paroxysmal atrial fibrillation: Secondary | ICD-10-CM | POA: Insufficient documentation

## 2019-11-10 DIAGNOSIS — I517 Cardiomegaly: Secondary | ICD-10-CM | POA: Insufficient documentation

## 2019-11-10 DIAGNOSIS — R600 Localized edema: Secondary | ICD-10-CM

## 2019-11-10 HISTORY — DX: Paroxysmal atrial fibrillation: I48.0

## 2019-11-10 HISTORY — DX: Cardiomegaly: I51.7

## 2019-11-10 HISTORY — DX: Essential (primary) hypertension: I10

## 2019-11-10 MED ORDER — HYDROCHLOROTHIAZIDE 12.5 MG PO CAPS: 12.5000 mg | ORAL_CAPSULE | Freq: Every day | ORAL | 2 refills | Status: DC

## 2019-11-10 MED ORDER — AMLODIPINE BESYLATE 2.5 MG PO TABS: 2.5000 mg | ORAL_TABLET | Freq: Every day | ORAL | 2 refills | Status: AC

## 2019-11-10 NOTE — Patient Instructions (Signed)
Medication Instructions:  Your physician has recommended you make the following change in your medication:   START: Amlodipine 2.5 mg daily   START: Hydrochlorothiazide 12.5 mg daily   *If you need a refill on your cardiac medications before your next appointment, please call your pharmacy*   Lab Work: None.  If you have labs (blood work) drawn today and your tests are completely normal, you will receive your results only by: Marland Kitchen MyChart Message (if you have MyChart) OR . A paper copy in the mail If you have any lab test that is abnormal or we need to change your treatment, we will call you to review the results.   Testing/Procedures: Your physician has requested that you have an echocardiogram. Echocardiography is a painless test that uses sound waves to create images of your heart. It provides your doctor with information about the size and shape of your heart and how well your heart's chambers and valves are working. This procedure takes approximately one hour. There are no restrictions for this procedure.     Follow-Up: At [facility], you and your health needs are our priority.  As part of our continuing mission to provide you with exceptional heart care, we have created designated Provider Care Teams.  These Care Teams include your primary Cardiologist (physician) and Advanced Practice Providers (APPs -  Physician Assistants and Nurse Practitioners) who all work together to provide you with the care you need, when you need it.  We recommend signing up for the patient portal called "MyChart".  Sign up information is provided on this After Visit Summary.  MyChart is used to connect with patients for Virtual Visits (Telemedicine).  Patients are able to view lab/test results, encounter notes, upcoming appointments, etc.  Non-urgent messages can be sent to your provider as well.   To learn more about what you can do with MyChart, go to [email or web address].    Your next  appointment:   1 month(s)  The format for your next appointment:   In Person  Provider:   Berniece Salines, DO   Other Instructions   Echocardiogram An echocardiogram is a procedure that uses painless sound waves (ultrasound) to produce an image of the heart. Images from an echocardiogram can provide important information about:  Signs of coronary artery disease (CAD).  Aneurysm detection. An aneurysm is a weak or damaged part of an artery wall that bulges out from the normal force of blood pumping through the body.  Heart size and shape. Changes in the size or shape of the heart can be associated with certain conditions, including heart failure, aneurysm, and CAD.  Heart muscle function.  Heart valve function.  Signs of a past heart attack.  Fluid buildup around the heart.  Thickening of the heart muscle.  A tumor or infectious growth around the heart valves. Tell a health care provider about:  Any allergies you have.  All medicines you are taking, including vitamins, herbs, eye drops, creams, and over-the-counter medicines.  Any blood disorders you have.  Any surgeries you have had.  Any medical conditions you have.  Whether you are pregnant or may be pregnant. What are the risks? Generally, this is a safe procedure. However, problems may occur, including:  Allergic reaction to dye (contrast) that may be used during the procedure. What happens before the procedure? No specific preparation is needed. You may eat and drink normally. What happens during the procedure?   An IV tube may be inserted into one of your  veins.  You may receive contrast through this tube. A contrast is an injection that improves the quality of the pictures from your heart.  A gel will be applied to your chest.  A wand-like tool (transducer) will be moved over your chest. The gel will help to transmit the sound waves from the transducer.  The sound waves will harmlessly bounce off of  your heart to allow the heart images to be captured in real-time motion. The images will be recorded on a computer. The procedure may vary among health care providers and hospitals. What happens after the procedure?  You may return to your normal, everyday life, including diet, activities, and medicines, unless your health care provider tells you not to do that. Summary  An echocardiogram is a procedure that uses painless sound waves (ultrasound) to produce an image of the heart.  Images from an echocardiogram can provide important information about the size and shape of your heart, heart muscle function, heart valve function, and fluid buildup around your heart.  You do not need to do anything to prepare before this procedure. You may eat and drink normally.  After the echocardiogram is completed, you may return to your normal, everyday life, unless your health care provider tells you not to do that. This information is not intended to replace advice given to you by your health care provider. Make sure you discuss any questions you have with your health care provider. Document Revised: 10/24/2018 Document Reviewed: 08/05/2016 Elsevier Patient Education  2020 [employer]  Amlodipine Oral Tablets What is this medicine? AMLODIPINE (am LOE di peen) is a calcium channel blocker. It relaxes your blood vessels and decreases the amount of work the heart has to do. It treats high blood pressure and/or prevents chest pain (also called angina). This medicine may be used for other purposes; ask your health care provider or pharmacist if you have questions. COMMON BRAND NAME(S): Norvasc What should I tell my health care provider before I take this medicine? They need to know if you have any of these conditions:  heart disease  liver disease  an unusual or allergic reaction to amlodipine, other drugs, foods, dyes, or preservatives  pregnant or trying to get pregnant  breast-feeding How  should I use this medicine? Take this drug by mouth. Take it as directed on the prescription label at the same time every day. You can take it with or without food. If it upsets your stomach, take it with food. Keep taking it unless your health care provider tells you to stop. Talk to your health care provider about the use of this drug in children. While it may be prescribed for children as young as 6 for selected conditions, precautions do apply. Overdosage: If you think you have taken too much of this medicine contact a poison control center or emergency room at once. NOTE: This medicine is only for you. Do not share this medicine with others. What if I miss a dose? If you miss a dose, take it as soon as you can. If it is almost time for your next dose, take only that dose. Do not take double or extra doses. What may interact with this medicine? This medicine may interact with the following medications:  clarithromycin  cyclosporine  diltiazem  itraconazole  simvastatin  tacrolimus This list may not describe all possible interactions. Give your health care provider a list of all the medicines, herbs, non-prescription drugs, or dietary supplements you use. Also tell them  if you smoke, drink alcohol, or use illegal drugs. Some items may interact with your medicine. What should I watch for while using this medicine? Visit your health care provider for regular checks on your progress. Check your blood pressure as directed. Ask your health care provider what your blood pressure should be. Also, find out when you should contact him or her. Do not treat yourself for coughs, colds, or pain while you are using this drug without asking your health care provider for advice. Some drugs may increase your blood pressure. You may get drowsy or dizzy. Do not drive, use machinery, or do anything that needs mental alertness until you know how this drug affects you. Do not stand up or sit up quickly,  especially if you are an older patient. This reduces the risk of dizzy or fainting spells. What side effects may I notice from receiving this medicine? Side effects that you should report to your doctor or health care provider as soon as possible:  allergic reactions (skin rash, itching or hives; swelling of the face, lips, or tongue)  heart attack (trouble breathing; pain or tightness in the chest, neck, back or arms; unusually weak or tired)  low blood pressure (dizziness; feeling faint or lightheaded, falls; unusually weak or tired) Side effects that usually do not require medical attention (report these to your doctor or health care provider if they continue or are bothersome):  facial flushing  nausea  palpitations  stomach pain  sudden weight gain  swelling of the ankles, feet, hands This list may not describe all possible side effects. Call your doctor for medical advice about side effects. You may report side effects to FDA at 1-800-FDA-1088. Where should I keep my medicine? Keep out of the reach of children and pets. Store at room temperature between 59 and 86 degrees F (15 and 30 degrees C). Protect from light and moisture. Keep the container tightly closed. Throw away any unused drug after the expiration date. NOTE: This sheet is a summary. It may not cover all possible information. If you have questions about this medicine, talk to your doctor, pharmacist, or health care provider.  2020 Elsevier/Gold Standard (2019-04-08 19:39:45)

## 2019-11-10 NOTE — Progress Notes (Signed)
Cardiology Office Note:    Date:  11/10/2019   ID:  Victoria Melendez, DOB [birth year], MRN [id number]  PCP:  Regino Bellowamos, Arlene G, MD  Cardiologist:  Thomasene RippleKardie Shayden Gingrich, DO  Electrophysiologist:  None   Referring MD: Newman Niparroll, Donna C, NP   Chief Complaint  Patient presents with  . New Patient (Initial Visit)    History of Present Illness:    Victoria JuneMonique Aicher is a 39 y.o. female with a hx of Morbid obesity, paroxysmal atrial fibrillation which was diagnosed back in 2017 at that time she underwent DC cardioversion 100 J x 1 and she returned to sinus rhythm.  It appears she has been on atenolol since and has not had any further follow-ups or has not needed any other cardioversion, hypertension presents today to be evaluated for hypertension.   The patient was referred for hypertension after she was seen [facility] for bronchitis however was noted to have elevated blood pressure at that time 181/100 mmHg.  Her chest x-ray also did show concern for cardiomegaly.  She is here for a visit today she denies any chest pain, shortness of breath, nausea, vomiting.  Past Medical History:  Diagnosis Date  . Morbid obesity (HCC)   . Paroxysmal A-fib (HCC)     History reviewed. No pertinent surgical history.  Current Medications: Current Meds  Medication Sig  . atenolol (TENORMIN) 50 MG tablet Take 50 mg by mouth daily.  . benzonatate (TESSALON) 100 MG capsule Take 1 capsule (100 mg total) by mouth every 8 (eight) hours.  . promethazine-dextromethorphan (PROMETHAZINE-DM) 6.25-15 MG/5ML syrup Take 1.3 mLs by mouth 4 (four) times daily as needed for cough.     Allergies:   Patient has no known allergies.   Social History   Socioeconomic History  . Marital status: Single    Spouse name: Not on file  . Number of children: Not on file  . Years of education: Not on file  . Highest education level: Not on file  Occupational History  . Not on file  Tobacco Use  . Smoking status: Current  Some Day Smoker  . Smokeless tobacco: Never Used  Substance and Sexual Activity  . Alcohol use: Yes    Comment: socially  . Drug use: No  . Sexual activity: Yes    Birth control/protection: None  Other Topics Concern  . Not on file  Social History Narrative  . Not on file   Social Determinants of Health   Financial Resource Strain:   . Difficulty of Paying Living Expenses:   Food Insecurity:   . Worried About Programme researcher, broadcasting/film/videounning Out of Food in the Last Year:   . Baristaan Out of Food in the Last Year:   Transportation Needs:   . Freight forwarderLack of Transportation (Medical):   Marland Kitchen. Lack of Transportation (Non-Medical):   Physical Activity:   . Days of Exercise per Week:   . Minutes of Exercise per Session:   Stress:   . Feeling of Stress :   Social Connections:   . Frequency of Communication with Friends and Family:   . Frequency of Social Gatherings with Friends and Family:   . Attends Religious Services:   . Active Member of Clubs or Organizations:   . Attends BankerClub or Organization Meetings:   Marland Kitchen. Marital Status:      Family History: The patient's family history is not on file.  ROS:   Review of Systems  Constitution: Negative for decreased appetite, fever and weight gain.  HENT: Negative for congestion, ear discharge, hoarse voice and sore throat.   Eyes: Negative for discharge, redness, vision loss in right eye and visual halos.  Cardiovascular: Negative for chest pain, dyspnea on exertion, leg swelling, orthopnea and palpitations.  Respiratory: Negative for cough, hemoptysis, shortness of breath and snoring.   Endocrine: Negative for heat intolerance and polyphagia.  Hematologic/Lymphatic: Negative for bleeding problem. Does not bruise/bleed easily.  Skin: Negative for flushing, nail changes, rash and suspicious lesions.  Musculoskeletal: Negative for arthritis, joint pain, muscle cramps, myalgias, neck pain and stiffness.  Gastrointestinal: Negative for abdominal pain, bowel incontinence,  diarrhea and excessive appetite.  Genitourinary: Negative for decreased libido, genital sores and incomplete emptying.  Neurological: Negative for brief paralysis, focal weakness, headaches and loss of balance.  Psychiatric/Behavioral: Negative for altered mental status, depression and suicidal ideas.  Allergic/Immunologic: Negative for HIV exposure and persistent infections.    EKGs/Labs/Other Studies Reviewed:    The following studies were reviewed today:   EKG:  The ekg ordered today demonstrates sinus rhythm, heart rate 64 bpm with concern for left atrial enlargement and nonspecific ST changes.   Recent Labs: 11/04/2019: BUN 8; Creatinine, Ser 0.70; Hemoglobin 12.1; Platelets 198; Potassium 3.5; Sodium 136  Recent Lipid Panel No results found for: CHOL, TRIG, HDL, CHOLHDL, VLDL, LDLCALC, LDLDIRECT  Physical Exam:    VS:  BP (!) 160/110   Pulse 64   Ht 5\' 1"  (1.549 m)   Wt 284 lb (128.8 kg)   LMP 10/16/2019   SpO2 99%   BMI 53.66 kg/m     Wt Readings from Last 3 Encounters:  11/10/19 284 lb (128.8 kg)  11/06/19 290 lb (131.5 kg)  11/04/19 290 lb (131.5 kg)     GEN: Morbidly obese, well nourished, well developed in no acute distress HEENT: Normal NECK: No JVD; No carotid bruits LYMPHATICS: No lymphadenopathy CARDIAC: S1S2 noted,RRR, no murmurs, rubs, gallops RESPIRATORY:  Clear to auscultation without rales, wheezing or rhonchi  ABDOMEN: Soft, non-tender, non-distended, +bowel sounds, no guarding. EXTREMITIES: +1 bilateral leg edema, No cyanosis, no clubbing MUSCULOSKELETAL:  No deformity  SKIN: Warm and dry NEUROLOGIC:  Alert and oriented x 3, non-focal PSYCHIATRIC:  Normal affect, good insight  ASSESSMENT:    1. Accelerated hypertension   2. Cardiomegaly on X-ray   3. Morbid obesity (HCC)   4. PAF (paroxysmal atrial fibrillation) (HCC)   5. Bilateral leg edema    PLAN:     1.  She is hypertensive in the office today.  She is on atenolol 50 mg daily.   She tells me that she take this medication without problem.  Her manual blood pressure is 160/110 therefore like to start the patient on amlodipine 2.5 mg daily along with hydrochlorothiazide 12.5 mg daily.  I also spoke to the patient in great depth about decreasing salt intake as she does have increased salt intake in her diet.  She expresses understanding.  2.  Paroxysmal atrial fibrillation-she tells me she has not felt any palpitations like she did back in 2017 and has continued to take her atenolol.  She is in sinus rhythm today.  3.  Bilateral leg edema with cardiomegaly/abnormal EKG with concern for left atrial enlargement-I like to get an echocardiogram to assess the patient LV function and for any structural abnormalities.  I will be also hydrochlorothiazide will help with improving her leg edema.  4.  Morbid obesity-the patient understands the need to lose weight with diet and exercise. We have discussed specific  strategies for this.  Recent lab work from April 2021 was reviewed by me.  The patient is in agreement with the above plan. The patient left the office in stable condition.  The patient will follow up in 1 month or sooner if needed.   Medication Adjustments/Labs and Tests Ordered: Current medicines are reviewed at length with the patient today.  Concerns regarding medicines are outlined above.  Orders Placed This Encounter  Procedures  . EKG 12-Lead  . ECHOCARDIOGRAM COMPLETE   Meds ordered this encounter  Medications  . amLODipine (NORVASC) 2.5 MG tablet    Sig: Take 1 tablet (2.5 mg total) by mouth daily.    Dispense:  30 tablet    Refill:  2  . hydrochlorothiazide (MICROZIDE) 12.5 MG capsule    Sig: Take 1 capsule (12.5 mg total) by mouth daily.    Dispense:  30 capsule    Refill:  2    Patient Instructions  Medication Instructions:  Your physician has recommended you make the following change in your medication:   START: Amlodipine 2.5 mg daily   START:  Hydrochlorothiazide 12.5 mg daily   *If you need a refill on your cardiac medications before your next appointment, please call your pharmacy*   Lab Work: None.  If you have labs (blood work) drawn today and your tests are completely normal, you will receive your results only by: Marland Kitchen MyChart Message (if you have MyChart) OR . A paper copy in the mail If you have any lab test that is abnormal or we need to change your treatment, we will call you to review the results.   Testing/Procedures: Your physician has requested that you have an echocardiogram. Echocardiography is a painless test that uses sound waves to create images of your heart. It provides your doctor with information about the size and shape of your heart and how well your heart's chambers and valves are working. This procedure takes approximately one hour. There are no restrictions for this procedure.     Follow-Up: At [facility], you and your health needs are our priority.  As part of our continuing mission to provide you with exceptional heart care, we have created designated Provider Care Teams.  These Care Teams include your primary Cardiologist (physician) and Advanced Practice Providers (APPs -  Physician Assistants and Nurse Practitioners) who all work together to provide you with the care you need, when you need it.  We recommend signing up for the patient portal called "MyChart".  Sign up information is provided on this After Visit Summary.  MyChart is used to connect with patients for Virtual Visits (Telemedicine).  Patients are able to view lab/test results, encounter notes, upcoming appointments, etc.  Non-urgent messages can be sent to your provider as well.   To learn more about what you can do with MyChart, go to [email or web address].    Your next appointment:   1 month(s)  The format for your next appointment:   In Person  Provider:   Thomasene Ripple, DO   Other Instructions   Echocardiogram An  echocardiogram is a procedure that uses painless sound waves (ultrasound) to produce an image of the heart. Images from an echocardiogram can provide important information about:  Signs of coronary artery disease (CAD).  Aneurysm detection. An aneurysm is a weak or damaged part of an artery wall that bulges out from the normal force of blood pumping through the body.  Heart size and shape. Changes in the size or shape of  the heart can be associated with certain conditions, including heart failure, aneurysm, and CAD.  Heart muscle function.  Heart valve function.  Signs of a past heart attack.  Fluid buildup around the heart.  Thickening of the heart muscle.  A tumor or infectious growth around the heart valves. Tell a health care provider about:  Any allergies you have.  All medicines you are taking, including vitamins, herbs, eye drops, creams, and over-the-counter medicines.  Any blood disorders you have.  Any surgeries you have had.  Any medical conditions you have.  Whether you are pregnant or may be pregnant. What are the risks? Generally, this is a safe procedure. However, problems may occur, including:  Allergic reaction to dye (contrast) that may be used during the procedure. What happens before the procedure? No specific preparation is needed. You may eat and drink normally. What happens during the procedure?   An IV tube may be inserted into one of your veins.  You may receive contrast through this tube. A contrast is an injection that improves the quality of the pictures from your heart.  A gel will be applied to your chest.  A wand-like tool (transducer) will be moved over your chest. The gel will help to transmit the sound waves from the transducer.  The sound waves will harmlessly bounce off of your heart to allow the heart images to be captured in real-time motion. The images will be recorded on a computer. The procedure may vary among health care  providers and hospitals. What happens after the procedure?  You may return to your normal, everyday life, including diet, activities, and medicines, unless your health care provider tells you not to do that. Summary  An echocardiogram is a procedure that uses painless sound waves (ultrasound) to produce an image of the heart.  Images from an echocardiogram can provide important information about the size and shape of your heart, heart muscle function, heart valve function, and fluid buildup around your heart.  You do not need to do anything to prepare before this procedure. You may eat and drink normally.  After the echocardiogram is completed, you may return to your normal, everyday life, unless your health care provider tells you not to do that. This information is not intended to replace advice given to you by your health care provider. Make sure you discuss any questions you have with your health care provider. Document Revised: 10/24/2018 Document Reviewed: 08/05/2016 Elsevier Patient Education  [place].  Amlodipine Oral Tablets What is this medicine? AMLODIPINE (am LOE di peen) is a calcium channel blocker. It relaxes your blood vessels and decreases the amount of work the heart has to do. It treats high blood pressure and/or prevents chest pain (also called angina). This medicine may be used for other purposes; ask your health care provider or pharmacist if you have questions. COMMON BRAND NAME(S): Norvasc What should I tell my health care provider before I take this medicine? They need to know if you have any of these conditions:  heart disease  liver disease  an unusual or allergic reaction to amlodipine, other drugs, foods, dyes, or preservatives  pregnant or trying to get pregnant  breast-feeding How should I use this medicine? Take this drug by mouth. Take it as directed on the prescription label at the same time every day. You can take it with or without  food. If it upsets your stomach, take it with food. Keep taking it unless your health care provider  tells you to stop. Talk to your health care provider about the use of this drug in children. While it may be prescribed for children as young as 6 for selected conditions, precautions do apply. Overdosage: If you think you have taken too much of this medicine contact a poison control center or emergency room at once. NOTE: This medicine is only for you. Do not share this medicine with others. What if I miss a dose? If you miss a dose, take it as soon as you can. If it is almost time for your next dose, take only that dose. Do not take double or extra doses. What may interact with this medicine? This medicine may interact with the following medications:  clarithromycin  cyclosporine  diltiazem  itraconazole  simvastatin  tacrolimus This list may not describe all possible interactions. Give your health care provider a list of all the medicines, herbs, non-prescription drugs, or dietary supplements you use. Also tell them if you smoke, drink alcohol, or use illegal drugs. Some items may interact with your medicine. What should I watch for while using this medicine? Visit your health care provider for regular checks on your progress. Check your blood pressure as directed. Ask your health care provider what your blood pressure should be. Also, find out when you should contact him or her. Do not treat yourself for coughs, colds, or pain while you are using this drug without asking your health care provider for advice. Some drugs may increase your blood pressure. You may get drowsy or dizzy. Do not drive, use machinery, or do anything that needs mental alertness until you know how this drug affects you. Do not stand up or sit up quickly, especially if you are an older patient. This reduces the risk of dizzy or fainting spells. What side effects may I notice from receiving this medicine? Side effects  that you should report to your doctor or health care provider as soon as possible:  allergic reactions (skin rash, itching or hives; swelling of the face, lips, or tongue)  heart attack (trouble breathing; pain or tightness in the chest, neck, back or arms; unusually weak or tired)  low blood pressure (dizziness; feeling faint or lightheaded, falls; unusually weak or tired) Side effects that usually do not require medical attention (report these to your doctor or health care provider if they continue or are bothersome):  facial flushing  nausea  palpitations  stomach pain  sudden weight gain  swelling of the ankles, feet, hands This list may not describe all possible side effects. Call your doctor for medical advice about side effects. You may report side effects to FDA at 1-800-FDA-1088. Where should I keep my medicine? Keep out of the reach of children and pets. Store at room temperature between 59 and 86 degrees F (15 and 30 degrees C). Protect from light and moisture. Keep the container tightly closed. Throw away any unused drug after the expiration date. NOTE: This sheet is a summary. It may not cover all possible information. If you have questions about this medicine, talk to your doctor, pharmacist, or health care provider.  2020 Elsevier/Gold Standard (2019-04-08 19:39:45)      Adopting a Healthy Lifestyle.  Know what a healthy weight is for you (roughly BMI <25) and aim to maintain this   Aim for 7+ servings of fruits and vegetables daily   65-80+ fluid ounces of water or unsweet tea for healthy kidneys   Limit to max 1 drink of alcohol per  day; avoid smoking/tobacco   Limit animal fats in diet for cholesterol and heart health - choose grass fed whenever available   Avoid highly processed foods, and foods high in saturated/trans fats   Aim for low stress - take time to unwind and care for your mental health   Aim for 150 min of moderate intensity exercise  weekly for heart health, and weights twice weekly for bone health   Aim for 7-9 hours of sleep daily   When it comes to diets, agreement about the perfect plan isnt easy to find, even among the experts. Experts at the [facility] [employer] developed an idea known as the Healthy Eating Plate. Just imagine a plate divided into logical, healthy portions.   The emphasis is on diet quality:   Load up on vegetables and fruits - one-half of your plate: Aim for color and variety, and remember that potatoes dont count.   Go for whole grains - one-quarter of your plate: Whole wheat, barley, wheat berries, quinoa, oats, brown rice, and foods made with them. If you want pasta, go with whole wheat pasta.   Protein power - one-quarter of your plate: Fish, chicken, beans, and nuts are all healthy, versatile protein sources. Limit red meat.   The diet, however, does go beyond the plate, offering a few other suggestions.   Use healthy plant oils, such as olive, canola, soy, corn, sunflower and peanut. Check the labels, and avoid partially hydrogenated oil, which have unhealthy trans fats.   If youre thirsty, drink water. Coffee and tea are good in moderation, but skip sugary drinks and limit milk and dairy products to one or two daily servings.   The type of carbohydrate in the diet is more important than the amount. Some sources of carbohydrates, such as vegetables, fruits, whole grains, and beans-are healthier than others.   Finally, stay active  Signed, Thomasene Ripple, DO  11/10/2019 8:38 PM    [facility]

## 2019-11-12 ENCOUNTER — Ambulatory Visit: Payer: Medicaid Other | Admitting: Cardiology

## 2019-11-17 ENCOUNTER — Other Ambulatory Visit: Payer: Self-pay

## 2019-11-17 ENCOUNTER — Ambulatory Visit (HOSPITAL_BASED_OUTPATIENT_CLINIC_OR_DEPARTMENT_OTHER)
Admission: RE | Admit: 2019-11-17 | Discharge: 2019-11-17 | Disposition: A | Discharge status: Home / Self Care | Payer: Medicaid Other | Source: Ambulatory Visit | Attending: Cardiology | Admitting: Cardiology

## 2019-11-17 DIAGNOSIS — I517 Cardiomegaly: Secondary | ICD-10-CM | POA: Diagnosis not present

## 2019-11-17 NOTE — Progress Notes (Signed)
  Echocardiogram 2D Echocardiogram has been performed.  Sinda Du 11/17/2019, 3:52 PM

## 2019-11-19 ENCOUNTER — Telehealth: Payer: Self-pay

## 2019-11-19 NOTE — Telephone Encounter (Signed)
Spoke with patient regarding results.  Patient verbalizes understanding and is agreeable to plan of care. Advised patient to call back with any issues or concerns.  

## 2019-11-19 NOTE — Telephone Encounter (Signed)
Left message on patients voicemail to please return our call.   

## 2019-11-19 NOTE — Telephone Encounter (Signed)
-----   Message from Thomasene Ripple, DO sent at 11/19/2019 11:21 AM EDT ----- Normal echo

## 2019-11-19 NOTE — Telephone Encounter (Signed)
Patient returned your call.

## 2019-12-12 ENCOUNTER — Ambulatory Visit (INDEPENDENT_AMBULATORY_CARE_PROVIDER_SITE_OTHER): Payer: Medicaid Other | Admitting: Cardiology

## 2019-12-12 ENCOUNTER — Other Ambulatory Visit: Payer: Self-pay

## 2019-12-12 ENCOUNTER — Encounter: Payer: Self-pay | Admitting: Cardiology

## 2019-12-12 ENCOUNTER — Ambulatory Visit: Payer: Medicaid Other | Admitting: Cardiology

## 2019-12-12 VITALS — BP 154/102 | HR 82 | Ht 61.0 in | Wt 294.0 lb

## 2019-12-12 DIAGNOSIS — I480 Paroxysmal atrial fibrillation: Secondary | ICD-10-CM | POA: Diagnosis not present

## 2019-12-12 DIAGNOSIS — I10 Essential (primary) hypertension: Secondary | ICD-10-CM | POA: Diagnosis not present

## 2019-12-12 MED ORDER — FUROSEMIDE 20 MG PO TABS: 20.0000 mg | ORAL_TABLET | Freq: Every day | ORAL | 0 refills | Status: AC

## 2019-12-12 MED ORDER — HYDROCHLOROTHIAZIDE 25 MG PO TABS: 25.0000 mg | ORAL_TABLET | Freq: Every day | ORAL | 3 refills | Status: AC

## 2019-12-12 MED ORDER — POTASSIUM CHLORIDE CRYS ER 20 MEQ PO TBCR: 20.0000 meq | EXTENDED_RELEASE_TABLET | Freq: Every day | ORAL | 0 refills | Status: AC

## 2019-12-12 NOTE — Patient Instructions (Signed)
Medication Instructions:  Your physician has recommended you make the following change in your medication:  Start HCTZ 25 mg daily. Take Lasix 20 mg on Saturday, Sunday and Monday only. Take KCL 20 MEQ on Saturday, Sunday and Monday only.  *If you need a refill on your cardiac medications before your next appointment, please call your pharmacy*   Lab Work: Your physician recommends that you return for lab work in: Wednesday 12/17/19.  You can come Monday through Friday 8:30 am to 12:00 pm and 1:15 to 4:30. You do not need to make an appointment as the order has already been placed. The labs you are going to have done are BMET and Magnesium.   If you have labs (blood work) drawn today and your tests are completely normal, you will receive your results only by: Marland Kitchen MyChart Message (if you have MyChart) OR . A paper copy in the mail If you have any lab test that is abnormal or we need to change your treatment, we will call you to review the results.   Testing/Procedures: None ordered   Follow-Up: At [facility], you and your health needs are our priority.  As part of our continuing mission to provide you with exceptional heart care, we have created designated Provider Care Teams.  These Care Teams include your primary Cardiologist (physician) and Advanced Practice Providers (APPs -  Physician Assistants and Nurse Practitioners) who all work together to provide you with the care you need, when you need it.  We recommend signing up for the patient portal called "MyChart".  Sign up information is provided on this After Visit Summary.  MyChart is used to connect with patients for Virtual Visits (Telemedicine).  Patients are able to view lab/test results, encounter notes, upcoming appointments, etc.  Non-urgent messages can be sent to your provider as well.   To learn more about what you can do with MyChart, go to [email or web address].    Your next appointment:   3 month(s)  The format  for your next appointment:   In Person  Provider:   Berniece Salines, DO   Other Instructions Hydrochlorothiazide, HCTZ; Irbesartan Tablets What is this medicine? HYDROCHLOROTHIAZIDE; IRBESARTAN (hye dro klor oh THYE a zide; ir be SAR tan) is a combination of a diuretic and an angiotensin II receptor blocker. It treats high blood pressure. This medicine may be used for other purposes; ask your health care provider or pharmacist if you have questions. COMMON BRAND NAME(S): Avalide What should I tell my health care provider before I take this medicine? They need to know if you have any of these conditions:  decreased urine  diabetes  if you are on a special diet, like a low salt diet  immune system problems, like lupus  kidney disease  liver disease  an unusual or allergic reaction to irbesartan, hydrochlorothiazide, sulfa drugs, other medicines, foods, dyes, or preservatives  pregnant or trying to get pregnant  breast-feeding How should I use this medicine? Take this drug by mouth. Take it as directed on the prescription label at the same time every day. You can take it with or without food. If it upsets your stomach, take it with food. Keep taking it unless your health care provider tells you to stop. Talk to your health care provider about the use of this drug in children. Special care may be needed. Overdosage: If you think you have taken too much of this medicine contact a poison control center or emergency room at once. NOTE:  This medicine is only for you. Do not share this medicine with others. What if I miss a dose? If you miss a dose, take it as soon as you can. If it is almost time for your next dose, take only that dose. Do not take double or extra doses. What may interact with this medicine?  barbiturates like phenobarbital  corticosteroids like prednisone  diabetic medicines  diuretics like triamterene, spironolactone or amiloride  lithium  NSAIDs like  ibuprofen  potassium salts or potassium supplements  prescription pain medicines  skeletal muscle relaxants like tubocurarine  some cholesterol lowering medicines like cholestyramine or colestipol This list may not describe all possible interactions. Give your health care provider a list of all the medicines, herbs, non-prescription drugs, or dietary supplements you use. Also tell them if you smoke, drink alcohol, or use illegal drugs. Some items may interact with your medicine. What should I watch for while using this medicine? You must visit your health care professional for regular checks on your progress. Check your blood pressure regularly while you are taking this medicine. Ask your doctor or health care professional what your blood pressure should be and when you should contact him or her. When you check your blood pressure, write down the measurements to show your doctor or health care professional. You must not get dehydrated. Ask your doctor or health care professional how much fluid you need to drink a day. Check with him or her if you get an attack of severe diarrhea, nausea and vomiting, or if you sweat a lot. The loss of too much body fluid can make it dangerous for you to take this medicine. Women should inform their doctor if they wish to become pregnant or think they might be pregnant. There is a potential for serious side effects to an unborn child, particularly in the second or third trimester. Talk to your health care professional or pharmacist for more information. You may get drowsy or dizzy. Do not drive, use machinery, or do anything that needs mental alertness until you know how this drug affects you. Do not stand or sit up quickly, especially if you are an older patient. This reduces the risk of dizzy or fainting spells. Alcohol can make you more drowsy and dizzy. Avoid alcoholic drinks. This medicine may increase blood sugar. Ask your healthcare provider if changes in diet  or medicines are needed if you have diabetes. Avoid salt substitutes unless you are told otherwise by your doctor or health care professional. Talk to your health care professional about your risk of skin cancer. You may be more at risk for skin cancer if you take this medicine. This medicine can make you more sensitive to the sun. Keep out of the sun. If you cannot avoid being in the sun, wear protective clothing and use sunscreen. Do not use sun lamps or tanning beds/booths. Do not treat yourself for coughs, colds, or pain while you are taking this medicine without asking your doctor or health care professional for advice. Some ingredients may increase your blood pressure. What side effects may I notice from receiving this medicine? Side effects that you should report to your doctor or health care professional as soon as possible:  allergic reactions like skin rash, itching or hives, swelling of the face, lips, or tongue  breathing problems  changes in vision  dark urine  eye pain  fast or irregular heart beat, palpitations, or chest pain  feeling faint or lightheaded  muscle cramps  persistent dry cough  redness, blistering, peeling or loosening of the skin, including inside the mouth   signs and symptoms of high blood sugar such as being more thirsty or hungry or having to urinate more than normal. You may also feel very tired or have blurry vision.  stomach pain  trouble passing urine  unusual bleeding or bruising  worsened gout pain  yellowing of the eyes or skin Side effects that usually do not require medical attention (report to your doctor or health care professional if they continue or are bothersome):  change in sex drive or performance  headache This list may not describe all possible side effects. Call your doctor for medical advice about side effects. You may report side effects to FDA at 1-800-FDA-1088. Where should I keep my medicine? Keep out of the  reach of children and pets. Store at room temperature between 15 and 30 degrees C (59 and 86 degrees F). Throw away any unused drug after the expiration date. NOTE: This sheet is a summary. It may not cover all possible information. If you have questions about this medicine, talk to your doctor, pharmacist, or health care provider.  2020 [employer] (2019-03-14 18:52:06)

## 2019-12-12 NOTE — Progress Notes (Signed)
Cardiology Office Note:    Date:  12/12/2019   ID:  Victoria Melendez, DOB Jan 19, 1981, MRN [id number]  PCP:  Regino Bellowamos, Arlene G, MD  Cardiologist:  Thomasene RippleKardie Jaz Laningham, DO  Electrophysiologist:  None   Referring MD: Regino Bellowamos, Arlene G, MD   Chief Complaint  Patient presents with  . Follow-up   "I am doing alright"  History of Present Illness:    Victoria Melendez is a 39 y.o. female with a hx of Morbid obesity, paroxysmal atrial fibrillation which was diagnosed back in 2017 at that time she underwent DC cardioversion 100 J x 1 and she returned to sinus rhythm, I did see the patient on November 10, 2019 at that time she was hypertensive.  Therefore I started her on amlodipine 2.5 mg daily along with hydrochlorothiazide 12.5 mg daily.  I encouraged her to decrease her salt intake.  She was continued on atenolol 50 mg daily for heart rate.  Today she is here for follow-up visit.  She did start her amlodipine but had not been taking her hydrochlorothiazide.  Past Medical History:  Diagnosis Date  . Morbid obesity (HCC)   . Paroxysmal A-fib (HCC)     History reviewed. No pertinent surgical history.  Current Medications: Current Meds  Medication Sig  . amLODipine (NORVASC) 2.5 MG tablet Take 1 tablet (2.5 mg total) by mouth daily.  Marland Kitchen. atenolol (TENORMIN) 50 MG tablet Take 50 mg by mouth daily.     Allergies:   Patient has no known allergies.   Social History   Socioeconomic History  . Marital status: Single    Spouse name: Not on file  . Number of children: Not on file  . Years of education: Not on file  . Highest education level: Not on file  Occupational History  . Not on file  Tobacco Use  . Smoking status: Current Some Day Smoker  . Smokeless tobacco: Never Used  Substance and Sexual Activity  . Alcohol use: Yes    Comment: socially  . Drug use: No  . Sexual activity: Yes    Birth control/protection: None  Other Topics Concern  . Not on file  Social History Narrative  . Not on file     Social Determinants of Health   Financial Resource Strain:   . Difficulty of Paying Living Expenses:   Food Insecurity:   . Worried About Programme researcher, broadcasting/film/videounning Out of Food in the Last Year:   . Baristaan Out of Food in the Last Year:   Transportation Needs:   . Freight forwarderLack of Transportation (Medical):   Marland Kitchen. Lack of Transportation (Non-Medical):   Physical Activity:   . Days of Exercise per Week:   . Minutes of Exercise per Session:   Stress:   . Feeling of Stress :   Social Connections:   . Frequency of Communication with Friends and Family:   . Frequency of Social Gatherings with Friends and Family:   . Attends Religious Services:   . Active Member of Clubs or Organizations:   . Attends BankerClub or Organization Meetings:   Marland Kitchen. Marital Status:      Family History: The patient's family history is not on file.  ROS:   Review of Systems  Constitution: Negative for decreased appetite, fever and weight gain.  HENT: Negative for congestion, ear discharge, hoarse voice and sore throat.   Eyes: Negative for discharge, redness, vision loss in right eye and visual halos.  Cardiovascular: Negative for chest pain, dyspnea on exertion, leg swelling, orthopnea and palpitations.  Respiratory: Negative for cough, hemoptysis, shortness of breath and snoring.   Endocrine: Negative for heat intolerance and polyphagia.  Hematologic/Lymphatic: Negative for bleeding problem. Does not bruise/bleed easily.  Skin: Negative for flushing, nail changes, rash and suspicious lesions.  Musculoskeletal: Negative for arthritis, joint pain, muscle cramps, myalgias, neck pain and stiffness.  Gastrointestinal: Negative for abdominal pain, bowel incontinence, diarrhea and excessive appetite.  Genitourinary: Negative for decreased libido, genital sores and incomplete emptying.  Neurological: Negative for brief paralysis, focal weakness, headaches and loss of balance.  Psychiatric/Behavioral: Negative for altered mental status, depression and  suicidal ideas.  Allergic/Immunologic: Negative for HIV exposure and persistent infections.    EKGs/Labs/Other Studies Reviewed:    The following studies were reviewed today:   EKG:  None today  TTE IMPRESSIONS 11/17/19 1. Left ventricular ejection fraction, by estimation, is 60 to 65%. The  left ventricle has normal function. The left ventricle has no regional  wall motion abnormalities.  2. Right ventricular systolic function is normal. The right ventricular  size is normal. There is normal pulmonary artery systolic pressure.  3. Left atrial size was mildly dilated.  4. The mitral valve is normal in structure. Trivial mitral valve  regurgitation. No evidence of mitral stenosis.  5. The aortic valve is normal in structure. Aortic valve regurgitation is  trivial. No aortic stenosis is present.  6. The inferior vena cava is normal in size with greater than 50%  respiratory variability, suggesting right atrial pressure of 3 mmHg.   Recent Labs: 11/04/2019: BUN 8; Creatinine, Ser 0.70; Hemoglobin 12.1; Platelets 198; Potassium 3.5; Sodium 136  Recent Lipid Panel No results found for: CHOL, TRIG, HDL, CHOLHDL, VLDL, LDLCALC, LDLDIRECT  Physical Exam:    VS:  BP (!) 154/102   Pulse 82   Ht 5\' 1"  (1.549 m)   Wt 294 lb (133.4 kg)   SpO2 98%   BMI 55.55 kg/m     Wt Readings from Last 3 Encounters:  12/12/19 294 lb (133.4 kg)  11/10/19 284 lb (128.8 kg)  11/06/19 290 lb (131.5 kg)     GEN: Obese well nourished, well developed in no acute distress HEENT: Normal NECK: No JVD; No carotid bruits LYMPHATICS: No lymphadenopathy CARDIAC: S1S2 noted,RRR, no murmurs, rubs, gallops RESPIRATORY:  Clear to auscultation without rales, wheezing or rhonchi  ABDOMEN: Soft, non-tender, non-distended, +bowel sounds, no guarding. EXTREMITIES: +1 bilateral leg edema, No cyanosis, no clubbing MUSCULOSKELETAL:  No deformity  SKIN: Warm and dry NEUROLOGIC:  Alert and oriented x 3,  non-focal PSYCHIATRIC:  Normal affect, good insight  ASSESSMENT:    1. Accelerated hypertension   2. Morbid obesity (HCC)   3. PAF (paroxysmal atrial fibrillation) (HCC)    PLAN:    1.  Her blood pressure has improved but she still is hypertensive in the office today.  I am going to continue her amlodipine and atenolol.  I have asked the patient to please start her hydrochlorothiazide which would now be 25 mg daily.  Given her fluid also I will give the patient Lasix 20 mg for 3 doses with potassium supplement to help remove some of her excess edema.  I have again educated patient about salt intake and she is willing to improve this.  2.  Morbid obesity-the patient understands the need to lose weight with diet and exercise. We have discussed specific strategies for this.  3.  Paroxysmal atrial fibrillation-continue patient on telemetry.  No indication at this time for anticoagulation.  4.  Blood  will be done to assess electrolytes and kidney function.  The patient is in agreement with the above plan. The patient left the office in stable condition.  The patient will follow up in 3 months or sooner if needed.   Medication Adjustments/Labs and Tests Ordered: Current medicines are reviewed at length with the patient today.  Concerns regarding medicines are outlined above.  Orders Placed This Encounter  Procedures  . Basic metabolic panel  . Magnesium   Meds ordered this encounter  Medications  . furosemide (LASIX) 20 MG tablet    Sig: Take 1 tablet (20 mg total) by mouth daily.    Dispense:  3 tablet    Refill:  0  . hydrochlorothiazide (HYDRODIURIL) 25 MG tablet    Sig: Take 1 tablet (25 mg total) by mouth daily.    Dispense:  90 tablet    Refill:  3  . potassium chloride SA (KLOR-CON) 20 MEQ tablet    Sig: Take 1 tablet (20 mEq total) by mouth daily.    Dispense:  3 tablet    Refill:  0    Patient Instructions  Medication Instructions:  Your physician has recommended you  make the following change in your medication:  Start HCTZ 25 mg daily. Take Lasix 20 mg on Saturday, Sunday and Monday only. Take KCL 20 MEQ on Saturday, Sunday and Monday only.  *If you need a refill on your cardiac medications before your next appointment, please call your pharmacy*   Lab Work: Your physician recommends that you return for lab work in: Wednesday 12/17/19.  You can come Monday through Friday 8:30 am to 12:00 pm and 1:15 to 4:30. You do not need to make an appointment as the order has already been placed. The labs you are going to have done are BMET and Magnesium.   If you have labs (blood work) drawn today and your tests are completely normal, you will receive your results only by: Marland Kitchen MyChart Message (if you have MyChart) OR . A paper copy in the mail If you have any lab test that is abnormal or we need to change your treatment, we will call you to review the results.   Testing/Procedures: None ordered   Follow-Up: At [facility], you and your health needs are our priority.  As part of our continuing mission to provide you with exceptional heart care, we have created designated Provider Care Teams.  These Care Teams include your primary Cardiologist (physician) and Advanced Practice Providers (APPs -  Physician Assistants and Nurse Practitioners) who all work together to provide you with the care you need, when you need it.  We recommend signing up for the patient portal called "MyChart".  Sign up information is provided on this After Visit Summary.  MyChart is used to connect with patients for Virtual Visits (Telemedicine).  Patients are able to view lab/test results, encounter notes, upcoming appointments, etc.  Non-urgent messages can be sent to your provider as well.   To learn more about what you can do with MyChart, go to [email or web address].    Your next appointment:   3 month(s)  The format for your next appointment:   In Person  Provider:   Berniece Salines, DO   Other Instructions Hydrochlorothiazide, HCTZ; Irbesartan Tablets What is this medicine? HYDROCHLOROTHIAZIDE; IRBESARTAN (hye dro klor oh THYE a zide; ir be SAR tan) is a combination of a diuretic and an angiotensin II receptor blocker. It treats high blood pressure. This medicine may be  used for other purposes; ask your health care provider or pharmacist if you have questions. COMMON BRAND NAME(S): Avalide What should I tell my health care provider before I take this medicine? They need to know if you have any of these conditions:  decreased urine  diabetes  if you are on a special diet, like a low salt diet  immune system problems, like lupus  kidney disease  liver disease  an unusual or allergic reaction to irbesartan, hydrochlorothiazide, sulfa drugs, other medicines, foods, dyes, or preservatives  pregnant or trying to get pregnant  breast-feeding How should I use this medicine? Take this drug by mouth. Take it as directed on the prescription label at the same time every day. You can take it with or without food. If it upsets your stomach, take it with food. Keep taking it unless your health care provider tells you to stop. Talk to your health care provider about the use of this drug in children. Special care may be needed. Overdosage: If you think you have taken too much of this medicine contact a poison control center or emergency room at once. NOTE: This medicine is only for you. Do not share this medicine with others. What if I miss a dose? If you miss a dose, take it as soon as you can. If it is almost time for your next dose, take only that dose. Do not take double or extra doses. What may interact with this medicine?  barbiturates like phenobarbital  corticosteroids like prednisone  diabetic medicines  diuretics like triamterene, spironolactone or amiloride  lithium  NSAIDs like ibuprofen  potassium salts or potassium supplements  prescription  pain medicines  skeletal muscle relaxants like tubocurarine  some cholesterol lowering medicines like cholestyramine or colestipol This list may not describe all possible interactions. Give your health care provider a list of all the medicines, herbs, non-prescription drugs, or dietary supplements you use. Also tell them if you smoke, drink alcohol, or use illegal drugs. Some items may interact with your medicine. What should I watch for while using this medicine? You must visit your health care professional for regular checks on your progress. Check your blood pressure regularly while you are taking this medicine. Ask your doctor or health care professional what your blood pressure should be and when you should contact him or her. When you check your blood pressure, write down the measurements to show your doctor or health care professional. You must not get dehydrated. Ask your doctor or health care professional how much fluid you need to drink a day. Check with him or her if you get an attack of severe diarrhea, nausea and vomiting, or if you sweat a lot. The loss of too much body fluid can make it dangerous for you to take this medicine. Women should inform their doctor if they wish to become pregnant or think they might be pregnant. There is a potential for serious side effects to an unborn child, particularly in the second or third trimester. Talk to your health care professional or pharmacist for more information. You may get drowsy or dizzy. Do not drive, use machinery, or do anything that needs mental alertness until you know how this drug affects you. Do not stand or sit up quickly, especially if you are an older patient. This reduces the risk of dizzy or fainting spells. Alcohol can make you more drowsy and dizzy. Avoid alcoholic drinks. This medicine may increase blood sugar. Ask your healthcare provider if changes in  diet or medicines are needed if you have diabetes. Avoid salt substitutes  unless you are told otherwise by your doctor or health care professional. Talk to your health care professional about your risk of skin cancer. You may be more at risk for skin cancer if you take this medicine. This medicine can make you more sensitive to the sun. Keep out of the sun. If you cannot avoid being in the sun, wear protective clothing and use sunscreen. Do not use sun lamps or tanning beds/booths. Do not treat yourself for coughs, colds, or pain while you are taking this medicine without asking your doctor or health care professional for advice. Some ingredients may increase your blood pressure. What side effects may I notice from receiving this medicine? Side effects that you should report to your doctor or health care professional as soon as possible:  allergic reactions like skin rash, itching or hives, swelling of the face, lips, or tongue  breathing problems  changes in vision  dark urine  eye pain  fast or irregular heart beat, palpitations, or chest pain  feeling faint or lightheaded  muscle cramps  persistent dry cough  redness, blistering, peeling or loosening of the skin, including inside the mouth   signs and symptoms of high blood sugar such as being more thirsty or hungry or having to urinate more than normal. You may also feel very tired or have blurry vision.  stomach pain  trouble passing urine  unusual bleeding or bruising  worsened gout pain  yellowing of the eyes or skin Side effects that usually do not require medical attention (report to your doctor or health care professional if they continue or are bothersome):  change in sex drive or performance  headache This list may not describe all possible side effects. Call your doctor for medical advice about side effects. You may report side effects to FDA at 1-800-FDA-1088. Where should I keep my medicine? Keep out of the reach of children and pets. Store at room temperature between 15 and 30  degrees C (59 and 86 degrees F). Throw away any unused drug after the expiration date. NOTE: This sheet is a summary. It may not cover all possible information. If you have questions about this medicine, talk to your doctor, pharmacist, or health care provider.  2020 Elsevier/Gold Standard (2019-03-14 18:52:06)      Adopting a Healthy Lifestyle.  Know what a healthy weight is for you (roughly BMI <25) and aim to maintain this   Aim for 7+ servings of fruits and vegetables daily   65-80+ fluid ounces of water or unsweet tea for healthy kidneys   Limit to max 1 drink of alcohol per day; avoid smoking/tobacco   Limit animal fats in diet for cholesterol and heart health - choose grass fed whenever available   Avoid highly processed foods, and foods high in saturated/trans fats   Aim for low stress - take time to unwind and care for your mental health   Aim for 150 min of moderate intensity exercise weekly for heart health, and weights twice weekly for bone health   Aim for 7-9 hours of sleep daily   When it comes to diets, agreement about the perfect plan isnt easy to find, even among the experts. Experts at the [facility] [employer] developed an idea known as the Healthy Eating Plate. Just imagine a plate divided into logical, healthy portions.   The emphasis is on diet quality:   Load up on  vegetables and fruits - one-half of your plate: Aim for color and variety, and remember that potatoes dont count.   Go for whole grains - one-quarter of your plate: Whole wheat, barley, wheat berries, quinoa, oats, brown rice, and foods made with them. If you want pasta, go with whole wheat pasta.   Protein power - one-quarter of your plate: Fish, chicken, beans, and nuts are all healthy, versatile protein sources. Limit red meat.   The diet, however, does go beyond the plate, offering a few other suggestions.   Use healthy plant oils, such as olive, canola, soy, corn, sunflower  and peanut. Check the labels, and avoid partially hydrogenated oil, which have unhealthy trans fats.   If youre thirsty, drink water. Coffee and tea are good in moderation, but skip sugary drinks and limit milk and dairy products to one or two daily servings.   The type of carbohydrate in the diet is more important than the amount. Some sources of carbohydrates, such as vegetables, fruits, whole grains, and beans-are healthier than others.   Finally, stay active  Signed, Thomasene Ripple, DO  12/12/2019 4:31 PM    [facility]

## 2020-03-02 DIAGNOSIS — I480 Paroxysmal atrial fibrillation: Secondary | ICD-10-CM | POA: Insufficient documentation

## 2020-03-03 ENCOUNTER — Ambulatory Visit: Payer: Medicaid Other | Admitting: Cardiology

## 2020-07-18 ENCOUNTER — Other Ambulatory Visit: Payer: Self-pay

## 2020-07-18 ENCOUNTER — Emergency Department (HOSPITAL_BASED_OUTPATIENT_CLINIC_OR_DEPARTMENT_OTHER)
Admission: EM | Admit: 2020-07-18 | Discharge: 2020-07-18 | Disposition: A | Discharge status: Home / Self Care | Payer: Medicaid Other | Attending: Emergency Medicine | Admitting: Emergency Medicine

## 2020-07-18 ENCOUNTER — Encounter (HOSPITAL_BASED_OUTPATIENT_CLINIC_OR_DEPARTMENT_OTHER): Payer: Self-pay | Admitting: *Deleted

## 2020-07-18 DIAGNOSIS — R059 Cough, unspecified: Secondary | ICD-10-CM | POA: Diagnosis present

## 2020-07-18 DIAGNOSIS — U071 COVID-19: Secondary | ICD-10-CM | POA: Diagnosis not present

## 2020-07-18 DIAGNOSIS — F17210 Nicotine dependence, cigarettes, uncomplicated: Secondary | ICD-10-CM | POA: Diagnosis not present

## 2020-07-18 DIAGNOSIS — I4891 Unspecified atrial fibrillation: Secondary | ICD-10-CM | POA: Diagnosis not present

## 2020-07-18 DIAGNOSIS — I10 Essential (primary) hypertension: Secondary | ICD-10-CM | POA: Insufficient documentation

## 2020-07-18 DIAGNOSIS — Z79899 Other long term (current) drug therapy: Secondary | ICD-10-CM | POA: Insufficient documentation

## 2020-07-18 DIAGNOSIS — Z20822 Contact with and (suspected) exposure to covid-19: Secondary | ICD-10-CM

## 2020-07-18 MED ORDER — BENZONATATE 100 MG PO CAPS
100.0000 mg | ORAL_CAPSULE | Freq: Once | ORAL | Status: AC
  Administered 2020-07-18: 100 mg via ORAL
  Filled 2020-07-18: qty 1

## 2020-07-18 MED ORDER — BENZONATATE 100 MG PO CAPS: 100.0000 mg | ORAL_CAPSULE | Freq: Three times a day (TID) | ORAL | 0 refills | Status: AC

## 2020-07-18 MED ORDER — ONDANSETRON 4 MG PO TBDP: 4.0000 mg | ORAL_TABLET | Freq: Three times a day (TID) | ORAL | 0 refills | Status: AC | PRN

## 2020-07-18 MED ORDER — ACETAMINOPHEN 325 MG PO TABS
650.0000 mg | ORAL_TABLET | Freq: Once | ORAL | Status: AC | PRN
  Administered 2020-07-18: 650 mg via ORAL
  Filled 2020-07-18: qty 2

## 2020-07-18 MED ORDER — ACETAMINOPHEN ER 650 MG PO TBCR: 650.0000 mg | EXTENDED_RELEASE_TABLET | Freq: Three times a day (TID) | ORAL | 0 refills | Status: AC | PRN

## 2020-07-18 MED ORDER — BENZONATATE 100 MG PO CAPS
ORAL_CAPSULE | ORAL | Status: AC
  Filled 2020-07-18: qty 1

## 2020-07-18 NOTE — ED Provider Notes (Signed)
[facility] EMERGENCY DEPARTMENT Provider Note   CSN: [id number] Arrival date & time: 07/18/20  1500     History Chief Complaint  Patient presents with  . Cough    Covid exposure    Victoria Melendez is a 40 y.o. female with a past medical history significant for hypertension, paroxysmal A. Fib not on any anticoagulants, and cardiomegaly who presents to the ED due to chills, cough, sore throat, subjective fever x1 week.  Patient admits to intermittent shortness of breath mostly while coughing.  Denies lower extremity edema.  Denies associated chest pain.  Denies history of blood clots, recent surgeries or recent long immobilizations, and hormonal treatments. Cough is dry in nature. Patient states she was exposed to Covid over Christmas.  She is currently unvaccinated.  Denies abdominal pain and diarrhea.  Admits to one episode of nonbloody and nonbilious emesis yesterday. She has been taking Mucinex with moderate relief.   History obtained from patient and past medical records. No interpreter used during encounter.      Past Medical History:  Diagnosis Date  . Accelerated hypertension 11/10/2019  . Cardiomegaly 11/10/2019  . Essential hypertension 08/20/2015  . History of cardioversion 08/20/2015  . Menometrorrhagia 08/20/2015  . Migraine without status migrainosus, not intractable 07/24/2017  . Morbid obesity (HCC)   . Morbid obesity with BMI of 50.0-59.9, adult (HCC) 07/25/2016  . PAF (paroxysmal atrial fibrillation) (HCC) 11/10/2019  . Paroxysmal A-fib (HCC)   . Seborrheic dermatitis of scalp 07/24/2017    Patient Active Problem List   Diagnosis Date Noted  . Paroxysmal A-fib (HCC)   . Cardiomegaly 11/10/2019  . Accelerated hypertension 11/10/2019  . Morbid obesity (HCC) 11/10/2019  . PAF (paroxysmal atrial fibrillation) (HCC) 11/10/2019  . Migraine without status migrainosus, not intractable 07/24/2017  . Seborrheic dermatitis of scalp 07/24/2017  . Morbid obesity with BMI of  50.0-59.9, adult (HCC) 07/25/2016  . Essential hypertension 08/20/2015  . History of cardioversion 08/20/2015  . Menometrorrhagia 08/20/2015    History reviewed. No pertinent surgical history.   OB History   No obstetric history on file.     No family history on file.  Social History   Tobacco Use  . Smoking status: Current Some Day Smoker    Types: Cigarettes  . Smokeless tobacco: Never Used  Vaping Use  . Vaping Use: Never used  Substance Use Topics  . Alcohol use: Yes    Comment: socially  . Drug use: No    Home Medications Prior to Admission medications   Medication Sig Start Date End Date Taking? Authorizing Provider  acetaminophen (TYLENOL 8 HOUR) 650 MG CR tablet Take 1 tablet (650 mg total) by mouth every 8 (eight) hours as needed for pain. 07/18/20  Yes Gypsy Kellogg, Merla Riches, PA-C  benzonatate (TESSALON) 100 MG capsule Take 1 capsule (100 mg total) by mouth every 8 (eight) hours. 07/18/20  Yes Geisha Abernathy C, PA-C  ondansetron (ZOFRAN ODT) 4 MG disintegrating tablet Take 1 tablet (4 mg total) by mouth every 8 (eight) hours as needed for nausea or vomiting. 07/18/20  Yes Yudith Norlander C, PA-C  amLODipine (NORVASC) 2.5 MG tablet Take 1 tablet (2.5 mg total) by mouth daily. 11/10/19 02/08/20  Tobb, Kardie, DO  atenolol (TENORMIN) 50 MG tablet Take 50 mg by mouth daily. 10/20/19   [provider]  furosemide (LASIX) 20 MG tablet Take 1 tablet (20 mg total) by mouth daily. 12/12/19 03/11/20  Tobb, Kardie, DO  hydrochlorothiazide (HYDRODIURIL) 25 MG tablet Take  1 tablet (25 mg total) by mouth daily. 12/12/19 03/11/20  Tobb, Kardie, DO  potassium chloride SA (KLOR-CON) 20 MEQ tablet Take 1 tablet (20 mEq total) by mouth daily. 12/12/19   Tobb, Lavona Mound, DO    Allergies    Patient has no known allergies.  Review of Systems   Review of Systems  Constitutional: Positive for chills and fever.  Respiratory: Positive for cough and shortness of breath.   Cardiovascular:  Negative for chest pain and leg swelling.  Gastrointestinal: Positive for nausea and vomiting. Negative for abdominal pain and diarrhea.    Physical Exam Updated Vital Signs BP 115/67 (BP Location: Right Arm)   Pulse 76   Temp 99.3 F (37.4 C) (Oral)   Resp 19   Ht 5\' 2"  (1.575 m)   Wt 136.1 kg   LMP 06/17/2020 (Approximate)   SpO2 97%   BMI 54.87 kg/m   Physical Exam Vitals and nursing note reviewed.  Constitutional:      General: She is not in acute distress.    Appearance: She is not ill-appearing.  HENT:     Head: Normocephalic.  Eyes:     Pupils: Pupils are equal, round, and reactive to light.  Neck:     Comments: No meningismus. Cardiovascular:     Rate and Rhythm: Normal rate and regular rhythm.     Pulses: Normal pulses.     Heart sounds: Normal heart sounds. No murmur heard. No friction rub. No gallop.   Pulmonary:     Effort: Pulmonary effort is normal.     Breath sounds: Normal breath sounds.     Comments: Respirations equal and unlabored, patient able to speak in full sentences, lungs clear to auscultation bilaterally Abdominal:     General: Abdomen is flat. Bowel sounds are normal. There is no distension.     Palpations: Abdomen is soft.     Tenderness: There is no abdominal tenderness. There is no guarding or rebound.  Musculoskeletal:     Cervical back: Neck supple.     Comments: No lower extremity edema.  Negative Homan sign bilaterally.  Skin:    General: Skin is warm and dry.  Neurological:     General: No focal deficit present.     Mental Status: She is alert.  Psychiatric:        Mood and Affect: Mood normal.        Behavior: Behavior normal.     ED Results / Procedures / Treatments   Labs (all labs ordered are listed, but only abnormal results are displayed) Labs Reviewed  SARS CORONAVIRUS 2 (TAT 6-24 HRS)    EKG None  Radiology No results found.  Procedures Procedures (including critical care time)  Medications Ordered in  ED Medications  benzonatate (TESSALON) capsule 100 mg (has no administration in time range)  acetaminophen (TYLENOL) tablet 650 mg (650 mg Oral Given 07/18/20 1558)    ED Course  I have reviewed the triage vital signs and the nursing notes.  Pertinent labs & imaging results that were available during my care of the patient were reviewed by me and considered in my medical decision making (see chart for details).    MDM Rules/Calculators/A&P                         40 year old female presents to the ED due to Covid-like symptoms x1 week.  Patient is unvaccinated.  Had a positive Covid exposure over Christmas.  Admits to  intermittent shortness of breath mostly while coughing.  Denies associated chest pain, lower extremity edema. No history of blood clots.  Upon arrival, patient febrile at 101.1 F, but otherwise reassuring vitals.  Patient not tachycardic or hypoxic.  Patient in no acute distress and nontoxic-appearing.  Physical exam reassuring.  Lungs clear to auscultation bilaterally.  No rales, rhonchi, or wheeze.  Low suspicion for pneumonia.  No meningismus to suggest meningitis.  Throat with mild erythema and no tonsillar hypertrophy or exudates.  No abscess appreciated on exam.  Abdomen soft, nondistended, nontender.  No lower extremity edema.  Low suspicion for PE/DVT.  Suspect symptoms related to Covid infection versus other viral etiology.  Patient discharged with symptomatic treatment.  Quarantine guidelines discussed. Strict ED precautions discussed with patient. Patient states understanding and agrees to plan. Patient discharged home in no acute distress and stable vitals.  Victoria Melendez was evaluated in Emergency Department on 07/18/2020 for the symptoms described in the history of present illness. She was evaluated in the context of the global COVID-19 pandemic, which necessitated consideration that the patient might be at risk for infection with the SARS-CoV-2 virus that causes COVID-19.  Institutional protocols and algorithms that pertain to the evaluation of patients at risk for COVID-19 are in a state of rapid change based on information released by regulatory bodies including the CDC and federal and state organizations. These policies and algorithms were followed during the patient's care in the ED.  Final Clinical Impression(s) / ED Diagnoses Final diagnoses:  Suspected COVID-19 virus infection    Rx / DC Orders ED Discharge Orders         Ordered    benzonatate (TESSALON) 100 MG capsule  Every 8 hours        07/18/20 2234    ondansetron (ZOFRAN ODT) 4 MG disintegrating tablet  Every 8 hours PRN        07/18/20 2234    acetaminophen (TYLENOL 8 HOUR) 650 MG CR tablet  Every 8 hours PRN        07/18/20 2234           Karie Kirks 07/18/20 2241    Drenda Freeze, MD 07/18/20 [phone]

## 2020-07-18 NOTE — ED Triage Notes (Addendum)
Pt reports chills, cough, sore throat, subjective fever, sob since Monday. States she vomited x 1 yesterday. Report she was exposed to covid over Christmas. She is unvaccinated for covid and flu

## 2020-07-18 NOTE — Discharge Instructions (Addendum)
As discussed, your Covid test is pending.  I am sending home with some symptomatic treatment.  Take as needed.  If your Covid result is positive you must quarantine for 10 days since symptom onset.  Please follow-up with PCP if symptoms do not improve within the next week.  Return to the ER for new or worsening symptoms.

## 2020-07-19 LAB — SARS CORONAVIRUS 2 (TAT 6-24 HRS): SARS Coronavirus 2: POSITIVE — AB

## 2020-11-30 IMAGING — CR DG CHEST 2V
2 series · 2 of 2 positions shown · non-contrast
Comparison: 07/19/2016

CLINICAL DATA: Chest pain and cough.

EXAM:
CHEST - 2 VIEW

[w chest pa]
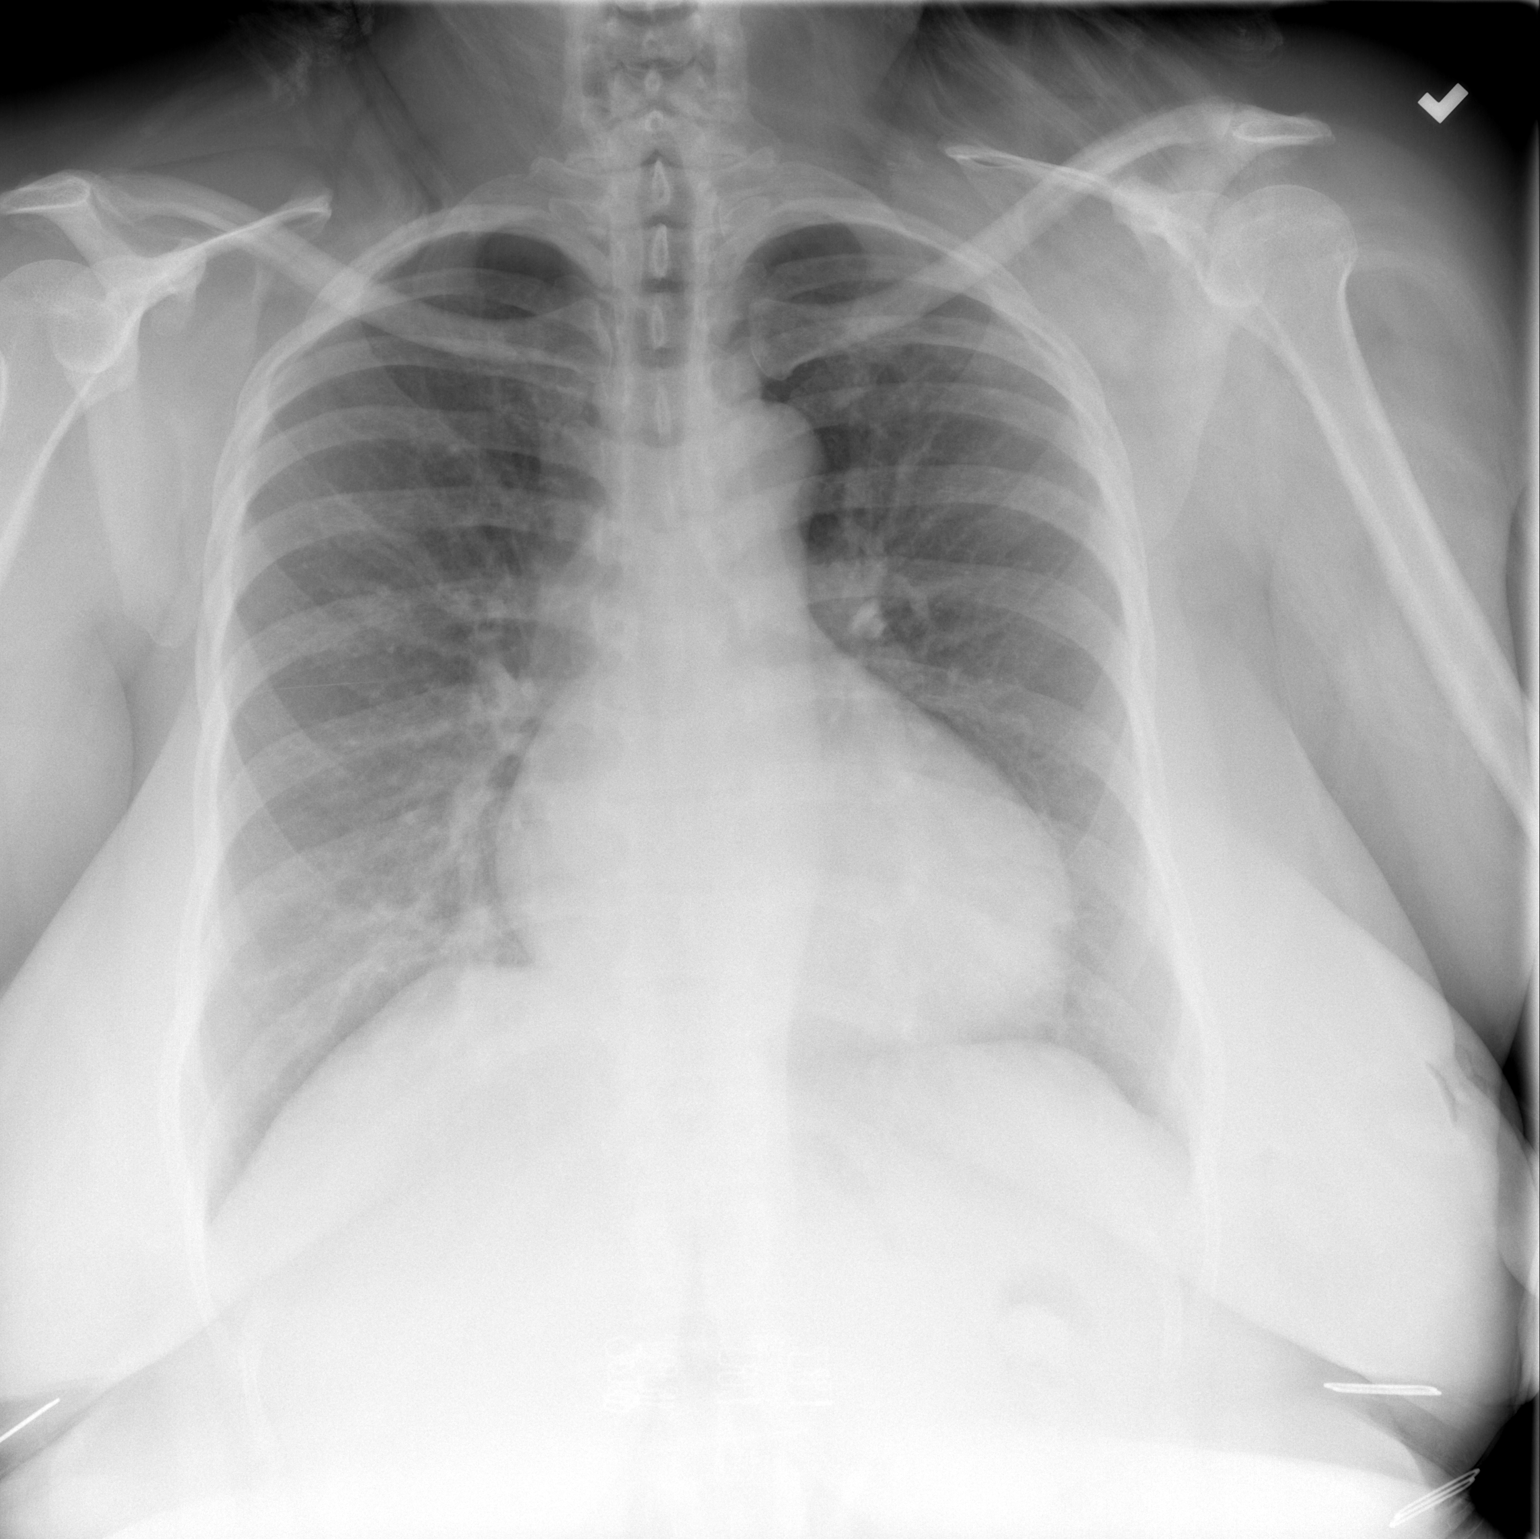

[w chest lat]
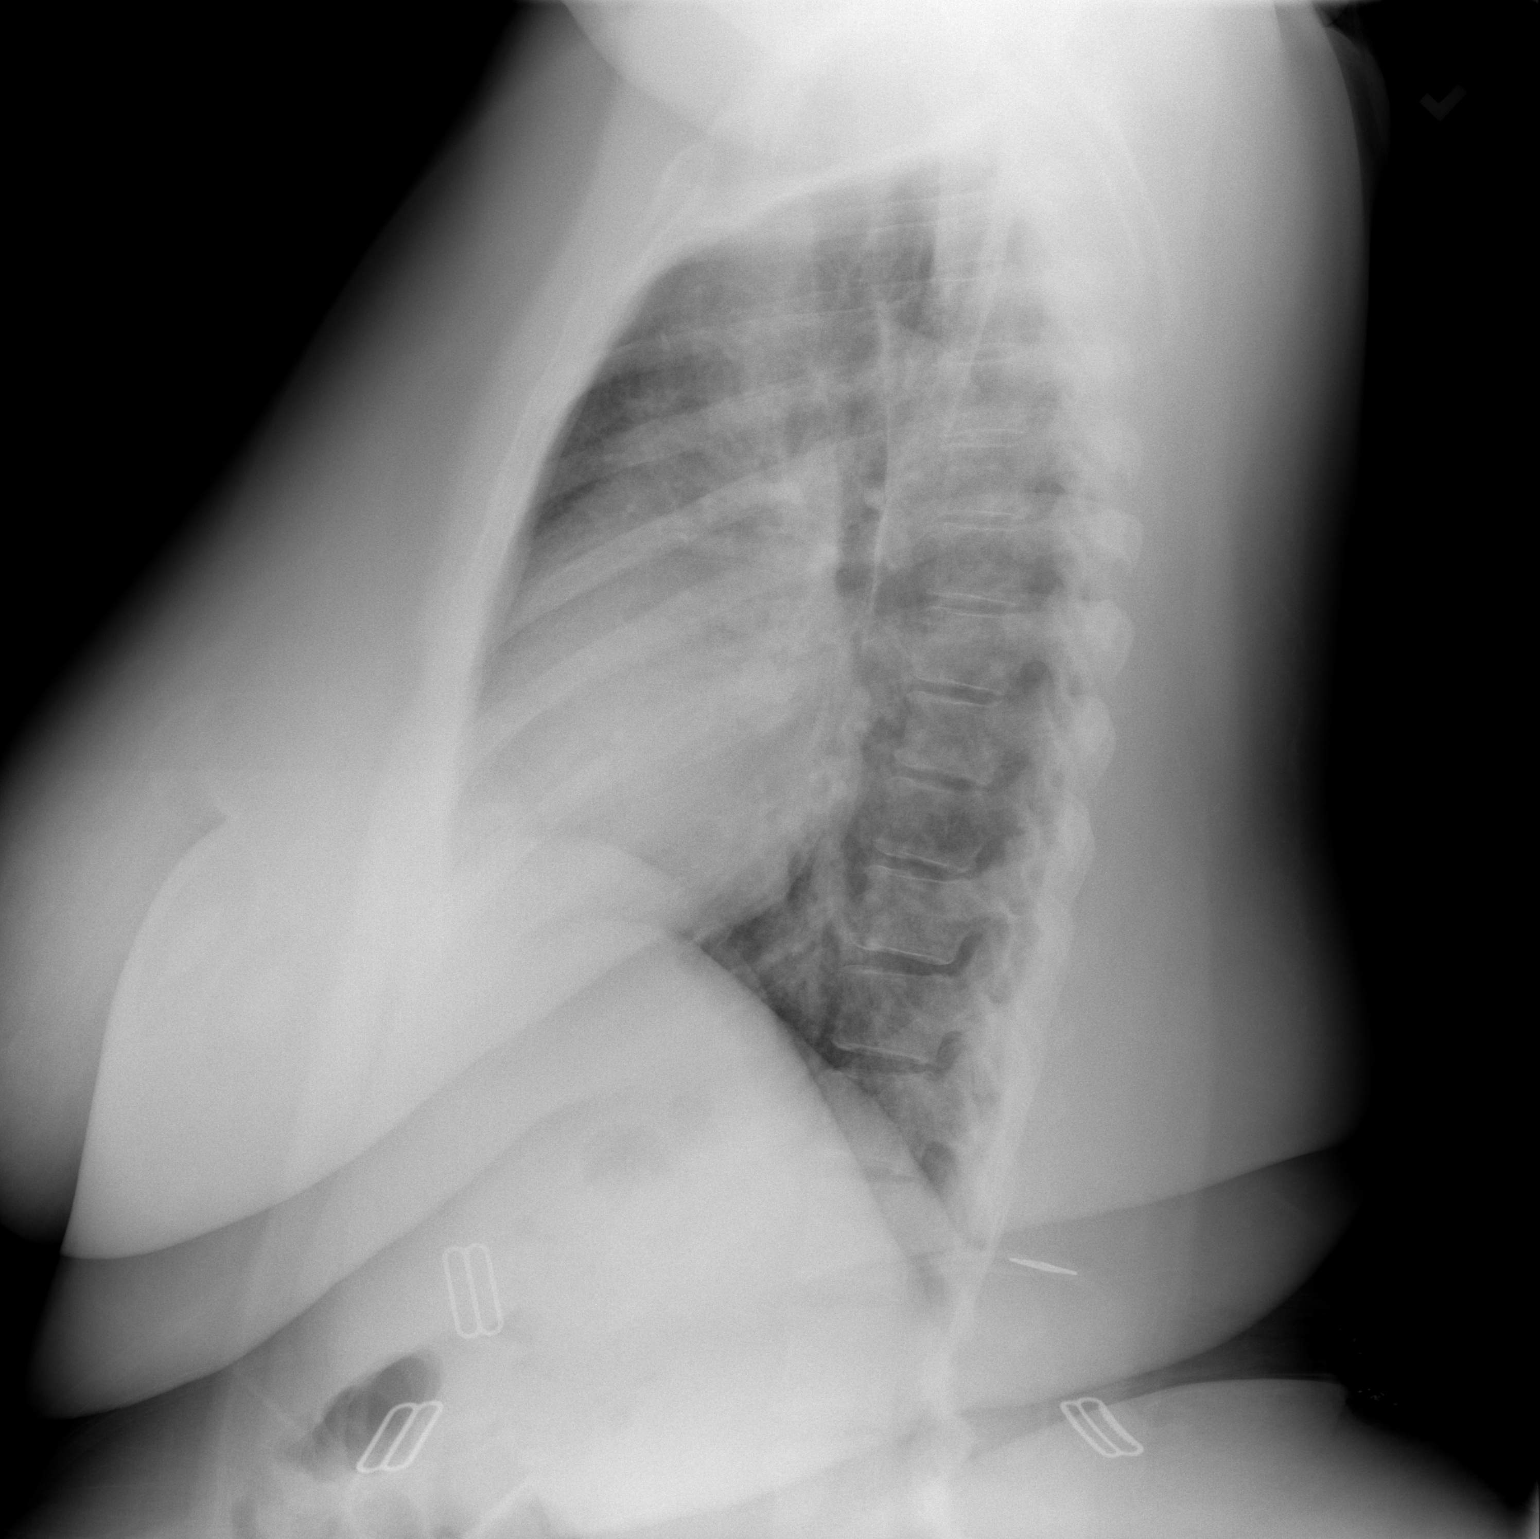

[2 of 2 positions shown; findings below may reference images not displayed]

FINDINGS: There is new mild cardiomegaly as compared to the prior study. The
pulmonary vascularity is normal. Lungs are clear. No effusions. No
bone abnormality.
IMPRESSION: New mild cardiomegaly.

## 2023-05-21 ENCOUNTER — Other Ambulatory Visit (HOSPITAL_BASED_OUTPATIENT_CLINIC_OR_DEPARTMENT_OTHER): Payer: Self-pay

## 2023-05-21 MED ORDER — WEGOVY 0.25 MG/0.5ML ~~LOC~~ SOAJ
0.2500 mg | SUBCUTANEOUS | 0 refills | Status: AC
  Filled 2023-05-21: qty 2, 28d supply, fill #0

## 2023-05-24 ENCOUNTER — Other Ambulatory Visit: Payer: Self-pay

## 2023-06-20 ENCOUNTER — Other Ambulatory Visit (HOSPITAL_BASED_OUTPATIENT_CLINIC_OR_DEPARTMENT_OTHER): Payer: Self-pay

## 2023-06-20 MED ORDER — WEGOVY 0.5 MG/0.5ML ~~LOC~~ SOAJ
0.5000 mg | SUBCUTANEOUS | 0 refills | Status: DC
  Filled 2023-06-20: qty 2, 28d supply, fill #0

## 2023-06-21 ENCOUNTER — Other Ambulatory Visit (HOSPITAL_COMMUNITY): Payer: Self-pay

## 2023-06-21 ENCOUNTER — Other Ambulatory Visit (HOSPITAL_BASED_OUTPATIENT_CLINIC_OR_DEPARTMENT_OTHER): Payer: Self-pay

## 2023-06-22 ENCOUNTER — Other Ambulatory Visit: Payer: Self-pay

## 2023-06-25 ENCOUNTER — Other Ambulatory Visit (HOSPITAL_BASED_OUTPATIENT_CLINIC_OR_DEPARTMENT_OTHER): Payer: Self-pay

## 2023-07-25 ENCOUNTER — Other Ambulatory Visit (HOSPITAL_BASED_OUTPATIENT_CLINIC_OR_DEPARTMENT_OTHER): Payer: Self-pay

## 2023-07-25 MED ORDER — WEGOVY 1 MG/0.5ML ~~LOC~~ SOAJ
1.0000 mg | SUBCUTANEOUS | 0 refills | Status: DC
  Filled 2023-07-25: qty 2, 28d supply, fill #0

## 2023-07-25 MED ORDER — ONDANSETRON 8 MG PO TBDP
8.0000 mg | ORAL_TABLET | Freq: Three times a day (TID) | ORAL | 0 refills | Status: AC | PRN
  Filled 2023-07-25: qty 20, 7d supply, fill #0

## 2023-07-26 ENCOUNTER — Other Ambulatory Visit (HOSPITAL_BASED_OUTPATIENT_CLINIC_OR_DEPARTMENT_OTHER): Payer: Self-pay
